# Patient Record
Sex: Female | Born: 1975 | Race: White | Hispanic: No | Marital: Single | Smoking: Current every day smoker
Health system: Southern US, Community
[De-identification: ages and names within clinical notes are randomized; demographics above are authoritative.]

## PROBLEM LIST (undated history)

## (undated) DIAGNOSIS — O99345 Other mental disorders complicating the puerperium: Secondary | ICD-10-CM

## (undated) DIAGNOSIS — J45909 Unspecified asthma, uncomplicated: Secondary | ICD-10-CM

## (undated) DIAGNOSIS — B029 Zoster without complications: Secondary | ICD-10-CM

## (undated) DIAGNOSIS — R55 Syncope and collapse: Secondary | ICD-10-CM

## (undated) DIAGNOSIS — F329 Major depressive disorder, single episode, unspecified: Secondary | ICD-10-CM

## (undated) DIAGNOSIS — R011 Cardiac murmur, unspecified: Secondary | ICD-10-CM

## (undated) DIAGNOSIS — R51 Headache: Secondary | ICD-10-CM

## (undated) DIAGNOSIS — F32A Depression, unspecified: Secondary | ICD-10-CM

## (undated) DIAGNOSIS — G459 Transient cerebral ischemic attack, unspecified: Secondary | ICD-10-CM

## (undated) DIAGNOSIS — F53 Postpartum depression: Secondary | ICD-10-CM

## (undated) DIAGNOSIS — Z8619 Personal history of other infectious and parasitic diseases: Secondary | ICD-10-CM

## (undated) DIAGNOSIS — R519 Headache, unspecified: Secondary | ICD-10-CM

## (undated) HISTORY — DX: Transient cerebral ischemic attack, unspecified: G45.9

## (undated) HISTORY — DX: Personal history of other infectious and parasitic diseases: Z86.19

## (undated) HISTORY — DX: Cardiac murmur, unspecified: R01.1

## (undated) HISTORY — DX: Depression, unspecified: F32.A

## (undated) HISTORY — PX: WISDOM TOOTH EXTRACTION: SHX21

## (undated) HISTORY — DX: Syncope and collapse: R55

## (undated) HISTORY — DX: Unspecified asthma, uncomplicated: J45.909

## (undated) HISTORY — DX: Headache: R51

## (undated) HISTORY — DX: Headache, unspecified: R51.9

## (undated) HISTORY — PX: TUBAL LIGATION: SHX77

## (undated) HISTORY — DX: Major depressive disorder, single episode, unspecified: F32.9

---
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
## 2008-12-22 ENCOUNTER — Emergency Department (HOSPITAL_BASED_OUTPATIENT_CLINIC_OR_DEPARTMENT_OTHER): Admission: EM | Admit: 2008-12-22 | Discharge: 2008-12-22 | Payer: Self-pay | Admitting: Emergency Medicine

## 2010-04-23 HISTORY — PX: OTHER SURGICAL HISTORY: SHX169

## 2011-01-05 ENCOUNTER — Emergency Department (HOSPITAL_BASED_OUTPATIENT_CLINIC_OR_DEPARTMENT_OTHER)
Admission: EM | Admit: 2011-01-05 | Discharge: 2011-01-05 | Discharge status: Against Medical Advice | Payer: Medicaid Other | Attending: Emergency Medicine | Admitting: Emergency Medicine

## 2011-01-05 ENCOUNTER — Encounter: Payer: Self-pay | Admitting: *Deleted

## 2011-01-05 DIAGNOSIS — M79609 Pain in unspecified limb: Secondary | ICD-10-CM | POA: Insufficient documentation

## 2011-01-05 NOTE — ED Notes (Signed)
Pain in both of her hands for months. Had twins 5 weeks ago. Her MD told her she has carpel tunnel and the symptoms would go away after delivery. Pain is no better.

## 2011-01-05 NOTE — ED Notes (Signed)
Pt stopped at registration desk states " I dont have time to wait for this I know I have carpal tunnell I just need something for the pain and see if I need to be referred." States to registration that she will follow up with her doctor next week

## 2011-04-24 DIAGNOSIS — G459 Transient cerebral ischemic attack, unspecified: Secondary | ICD-10-CM

## 2011-04-24 HISTORY — DX: Transient cerebral ischemic attack, unspecified: G45.9

## 2011-10-06 ENCOUNTER — Encounter (HOSPITAL_BASED_OUTPATIENT_CLINIC_OR_DEPARTMENT_OTHER): Payer: Self-pay | Admitting: *Deleted

## 2011-10-06 ENCOUNTER — Emergency Department (HOSPITAL_BASED_OUTPATIENT_CLINIC_OR_DEPARTMENT_OTHER)
Admission: EM | Admit: 2011-10-06 | Discharge: 2011-10-06 | Disposition: A | Discharge status: Home / Self Care | Payer: BC Managed Care – PPO | Attending: Emergency Medicine | Admitting: Emergency Medicine

## 2011-10-06 DIAGNOSIS — B029 Zoster without complications: Secondary | ICD-10-CM | POA: Insufficient documentation

## 2011-10-06 DIAGNOSIS — F17200 Nicotine dependence, unspecified, uncomplicated: Secondary | ICD-10-CM | POA: Insufficient documentation

## 2011-10-06 HISTORY — DX: Zoster without complications: B02.9

## 2011-10-06 MED ORDER — VALACYCLOVIR HCL 1 G PO TABS: 1000.0000 mg | ORAL_TABLET | Freq: Three times a day (TID) | ORAL | Status: AC

## 2011-10-06 NOTE — Discharge Instructions (Signed)
Shingles Shingles is caused by the same virus that causes chickenpox (varicella zoster virus or VZV). Shingles often occurs many years or decades after having chickenpox. That is why it is more common in adults older than 50 years. The virus reactivates and breaks out as an infection in a nerve root. SYMPTOMS   The initial feeling (sensations) may be pain. This pain is usually described as:   Burning.   Stabbing.   Throbbing.   Tingling in the nerve root.   A red rash will follow in a couple days. The rash may occur in any area of the body and is usually on one side (unilateral) of the body in a band or belt-like pattern. The rash usually starts out as very small blisters (vesicles). They will dry up after 7 to 10 days. This is not usually a significant problem except for the pain it causes.   Long-lasting (chronic) pain is more likely in an elderly person. It can last months to years. This condition is called postherpetic neuralgia.  Shingles can be an extremely severe infection in someone with AIDS, a weakened immune system, or with forms of leukemia. It can also be severe if you are taking transplant medicines or other medicines that weaken the immune system. TREATMENT  Your caregiver will often treat you with:  Antiviral drugs.   Anti-inflammatory drugs.   Pain medicines.  Bed rest is very important in preventing the pain associated with herpes zoster (postherpetic neuralgia). Application of heat in the form of a hot water bottle or electric heating pad or gentle pressure with the hand is recommended to help with the pain or discomfort. PREVENTION  A varicella zoster vaccine is available to help protect against the virus. The Food and Drug Administration approved the varicella zoster vaccine for individuals 82 years of age and older. HOME CARE INSTRUCTIONS   Cool compresses to the area of rash may be helpful.   Only take over-the-counter or prescription medicines for pain,  discomfort, or fever as directed by your caregiver.   Avoid contact with:   Babies.   Pregnant women.   Children with eczema.   Elderly people with transplants.   People with chronic illnesses, such as leukemia and AIDS.   If the area involved is on your face, you may receive a referral for follow-up to a specialist. It is very important to keep all follow-up appointments. This will help avoid eye complications, chronic pain, or disability.  SEEK IMMEDIATE MEDICAL CARE IF:   You develop any pain (headache) in the area of the face or eye. This must be followed carefully by your caregiver or ophthalmologist. An infection in part of your eye (cornea) can be very serious. It could lead to blindness.   You do not have pain relief from prescribed medicines.   Your redness or swelling spreads.   The area involved becomes very swollen and painful.   You have a fever.   You notice any red or painful lines extending away from the affected area toward your heart (lymphangitis).   Your condition is worsening or has changed.  Document Released: 04/09/2005 Document Revised: 03/29/2011 Document Reviewed: 03/14/2009 [facility] Patient Information 2012 [place], [place].  Valacyclovir caplets What is this medicine? VALACYCLOVIR (val ay SYE kloe veer) is an antiviral medicine. It is used to treat or prevent infections caused by certain kinds of viruses. Examples of these infections include herpes and shingles. This medicine will not cure herpes. This medicine may be used for other  purposes; ask your health care provider or pharmacist if you have questions. What should I tell my health care provider before I take this medicine? They need to know if you have any of these conditions: -acquired immunodeficiency syndrome (AIDS) -any other condition that may weaken the immune system -bone marrow or kidney transplant -kidney disease -an unusual or allergic reaction to valacyclovir, acyclovir,  ganciclovir, valganciclovir, other medicines, foods, dyes, or preservatives -pregnant or trying to get pregnant -breast-feeding How should I use this medicine? Take this medicine by mouth with a glass of water. Follow the directions on the prescription label. You can take this medicine with or without food. Take your doses at regular intervals. Do not take your medicine more often than directed. Finish the full course prescribed by your doctor or health care professional even if you think your condition is better. Do not stop taking except on the advice of your doctor or health care professional. Talk to your pediatrician regarding the use of this medicine in children. While this drug may be prescribed for children as young as 2 years for selected conditions, precautions do apply. Overdosage: If you think you have taken too much of this medicine contact a poison control center or emergency room at once. NOTE: This medicine is only for you. Do not share this medicine with others. What if I miss a dose? If you miss a dose, take it as soon as you can. If it is almost time for your next dose, take only that dose. Do not take double or extra doses. What may interact with this medicine? -cimetidine -probenecid This list may not describe all possible interactions. Give your health care provider a list of all the medicines, herbs, non-prescription drugs, or dietary supplements you use. Also tell them if you smoke, drink alcohol, or use illegal drugs. Some items may interact with your medicine. What should I watch for while using this medicine? Tell your doctor or health care professional if your symptoms do not start to get better after 1 week. This medicine works best when taken early in the course of an infection, within the first 72 hours. Begin treatment as soon as possible after the first signs of infection like tingling, itching, or pain in the affected area. It is possible that genital herpes may  still be spread even when you are not having symptoms. Always use safer sex practices like condoms made of latex or polyurethane whenever you have sexual contact. You should stay well hydrated while taking this medicine. Drink plenty of fluids. What side effects may I notice from receiving this medicine? Side effects that you should report to your doctor or health care professional as soon as possible: -allergic reactions like skin rash, itching or hives, swelling of the face, lips, or tongue -aggressive behavior -confusion -hallucinations -problems with balance, talking, walking -stomach pain -tremor -trouble passing urine or change in the amount of urine Side effects that usually do not require medical attention (report to your doctor or health care professional if they continue or are bothersome): -dizziness -headache -nausea, vomiting This list may not describe all possible side effects. Call your doctor for medical advice about side effects. You may report side effects to FDA at 1-800-FDA-1088. Where should I keep my medicine? Keep out of the reach of children. Store at room temperature between 15 and 25 degrees C (59 and 77 degrees F). Keep container tightly closed. Throw away any unused medicine after the expiration date. NOTE: This sheet is a  summary. It may not cover all possible information. If you have questions about this medicine, talk to your doctor, pharmacist, or health care provider.  2012, Elsevier/Gold Standard. (06/16/2007 6:10:08 PM)

## 2011-10-06 NOTE — ED Provider Notes (Signed)
History     CSN: [id number]  Arrival date & time 10/06/11  1139   First MD Initiated Contact with Patient 10/06/11 1202      Chief Complaint  Patient presents with  . Rash    (Consider location/radiation/quality/duration/timing/severity/associated sxs/prior treatment) Patient is a 36 y.o. female presenting with rash. The history is provided by the patient.  Rash   She had onset 3 days ago of a rash in the left lateral abdomen with associated burning pain typical of outbreaks of shingles. 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 She came to the emergency department today because her place of employment didn't want to allow her to return unless there was a doctor's note stating it was safe. Pain is mild to moderate with current pain rated 3/10. She's been taking over-the-counter analgesics for pain with reasonable relief. She denies fever or chills.   Past Medical History  Diagnosis Date  . Shingles     Past Surgical History  Procedure Date  . Cesarean section   . Appendectomy   . Tonsillectomy     No family history on file.  History  Substance Use Topics  . Smoking status: Current Everyday Smoker -- 0.5 packs/day  . Smokeless tobacco: Never Used  . Alcohol Use: No    OB History    Grav Para Term Preterm Abortions TAB SAB Ect Mult Living                  Review of Systems  Skin: Positive for rash.  All other systems reviewed and are negative.    Allergies  Betadine  Home Medications   Current Outpatient Rx  Name Route Sig Dispense Refill  . SERTRALINE HCL 100 MG PO TABS Oral Take 100 mg by mouth daily.    . IBUPROFEN PO Oral Take by mouth.      Marland Kitchen PRENATAL PO Oral Take by mouth.      Marland Kitchen VALACYCLOVIR HCL 1 G PO TABS Oral Take 1 tablet (1,000 mg total) by mouth 3 (three) times daily. 21 tablet 0    BP 122/69  Pulse 70  Temp 98.5 F (36.9 C) (Oral)  Resp 18  Ht 5\' 5"  (1.651 m)  Wt 155 lb (70.308 kg)  BMI 25.79 kg/m2   SpO2 100%  LMP 09/22/2011  Physical Exam  Nursing note and vitals reviewed.  36 year old female is resting comfortably in no acute distress. Vital signs are normal. Head is normal cephalic and atraumatic. PERRLA, EOMI. Neck is nontender and supple. Back is nontender. Lungs are clear without rales, wheezes, rhonchi. Heart has regular rate rhythm without murmur. Abdomen is soft, flat, nontender without masses or hepatosplenomegaly. Jimmy's have no cyanosis or edema, full range of motion is present. Skin is warm and dry. Rashes present in the left lateral abdomen and back. The rash and has breast vesicles on erythematous base consistent with herpes zoster. Neurologic: Mental status is normal, cranial nerves are intact, there are no motor or sensory deficits.  ED Course  Procedures (including critical care time)  Labs Reviewed - No data to display No results found.   1. Herpes zoster       MDM  Varicella-zoster in what appears to be at T11 or T12 dermatome on the left. She will be sent home with prescription for Valtrex. Note is given allowing her to work so long as the lesions are covered.        Dione Booze,  MD 10/06/11 1226

## 2011-10-06 NOTE — ED Notes (Signed)
Red burning rash to left flank- hx of shingles

## 2012-02-12 ENCOUNTER — Observation Stay (HOSPITAL_BASED_OUTPATIENT_CLINIC_OR_DEPARTMENT_OTHER)
Admission: EM | Admit: 2012-02-12 | Discharge: 2012-02-13 | Disposition: A | Discharge status: Home / Self Care | Payer: BC Managed Care – PPO | Attending: Emergency Medicine | Admitting: Emergency Medicine

## 2012-02-12 ENCOUNTER — Emergency Department (HOSPITAL_BASED_OUTPATIENT_CLINIC_OR_DEPARTMENT_OTHER): Payer: BC Managed Care – PPO

## 2012-02-12 ENCOUNTER — Encounter (HOSPITAL_BASED_OUTPATIENT_CLINIC_OR_DEPARTMENT_OTHER): Payer: Self-pay

## 2012-02-12 DIAGNOSIS — R209 Unspecified disturbances of skin sensation: Principal | ICD-10-CM | POA: Insufficient documentation

## 2012-02-12 DIAGNOSIS — R531 Weakness: Secondary | ICD-10-CM

## 2012-02-12 DIAGNOSIS — R4781 Slurred speech: Secondary | ICD-10-CM

## 2012-02-12 DIAGNOSIS — H539 Unspecified visual disturbance: Secondary | ICD-10-CM | POA: Insufficient documentation

## 2012-02-12 DIAGNOSIS — N390 Urinary tract infection, site not specified: Secondary | ICD-10-CM

## 2012-02-12 DIAGNOSIS — R42 Dizziness and giddiness: Secondary | ICD-10-CM | POA: Insufficient documentation

## 2012-02-12 DIAGNOSIS — R4701 Aphasia: Secondary | ICD-10-CM | POA: Insufficient documentation

## 2012-02-12 HISTORY — DX: Postpartum depression: F53.0

## 2012-02-12 HISTORY — DX: Other mental disorders complicating the puerperium: O99.345

## 2012-02-12 LAB — URINALYSIS, ROUTINE W REFLEX MICROSCOPIC
Bilirubin Urine: NEGATIVE
Glucose, UA: NEGATIVE mg/dL
Hgb urine dipstick: NEGATIVE
Specific Gravity, Urine: 1.012 (ref 1.005–1.030)
Urobilinogen, UA: 0.2 mg/dL (ref 0.0–1.0)

## 2012-02-12 LAB — RAPID URINE DRUG SCREEN, HOSP PERFORMED
Amphetamines: NOT DETECTED
Benzodiazepines: NOT DETECTED
Cocaine: NOT DETECTED
Opiates: NOT DETECTED

## 2012-02-12 LAB — COMPREHENSIVE METABOLIC PANEL
BUN: 17 mg/dL (ref 6–23)
Calcium: 9.7 mg/dL (ref 8.4–10.5)
Creatinine, Ser: 0.7 mg/dL (ref 0.50–1.10)
GFR calc Af Amer: >90 mL/min (ref >90)
GFR calc non Af Amer: >90 mL/min (ref >90)
Glucose, Bld: 92 mg/dL (ref 70–99)
Sodium: 140 mEq/L (ref 135–145)
Total Protein: 7.4 g/dL (ref 6.0–8.3)

## 2012-02-12 LAB — DIFFERENTIAL
Eosinophils Absolute: 0.3 10*3/uL (ref 0.0–0.7)
Eosinophils Relative: 4 % (ref 0–5)
Lymphs Abs: 2.7 10*3/uL (ref 0.7–4.0)
Monocytes Absolute: 1.1 10*3/uL — ABNORMAL HIGH (ref 0.1–1.0)
Monocytes Relative: 13 % — ABNORMAL HIGH (ref 3–12)

## 2012-02-12 LAB — CBC
HCT: 37.6 % (ref 36.0–46.0)
MCH: 28.5 pg (ref 26.0–34.0)
MCV: 85.1 fL (ref 78.0–100.0)
Platelets: 210 10*3/uL (ref 150–400)
RBC: 4.42 MIL/uL (ref 3.87–5.11)

## 2012-02-12 LAB — URINE MICROSCOPIC-ADD ON

## 2012-02-12 LAB — GLUCOSE, CAPILLARY: Glucose-Capillary: 85 mg/dL (ref 70–99)

## 2012-02-12 LAB — PROTIME-INR: Prothrombin Time: 13 seconds (ref 11.6–15.2)

## 2012-02-12 MED ORDER — ACETAMINOPHEN 325 MG PO TABS
ORAL_TABLET | ORAL | Status: AC
  Filled 2012-02-12: qty 2

## 2012-02-12 MED ORDER — TETRACAINE HCL 0.5 % OP SOLN
2.0000 [drp] | Freq: Once | OPHTHALMIC | Status: AC
  Administered 2012-02-13: 2 [drp] via OPHTHALMIC
  Filled 2012-02-12: qty 2

## 2012-02-12 MED ORDER — ACETAMINOPHEN 325 MG PO TABS
650.0000 mg | ORAL_TABLET | Freq: Once | ORAL | Status: AC
  Administered 2012-02-12: 650 mg via ORAL

## 2012-02-12 MED ORDER — FLUORESCEIN SODIUM 1 MG OP STRP
1.0000 | ORAL_STRIP | Freq: Once | OPHTHALMIC | Status: AC
  Administered 2012-02-13: 1 via OPHTHALMIC
  Filled 2012-02-12 (×2): qty 1

## 2012-02-12 NOTE — ED Notes (Signed)
Pt c/o neck pain, jaw pain x2 weeks.

## 2012-02-12 NOTE — ED Notes (Signed)
Pt speaking with clear sentences, denies numbness. Right eye conjunctiva is mildly red.

## 2012-02-12 NOTE — ED Notes (Signed)
[facility] for transport to [facility] , no truck available from [facility]   For " awhile"

## 2012-02-12 NOTE — ED Notes (Signed)
Pt reports intermittent facial numbness/tingling, difficulty speaking and dizziness since Thursday.

## 2012-02-12 NOTE — ED Notes (Signed)
Report called to [facility] RN CDU

## 2012-02-12 NOTE — ED Notes (Signed)
Patient transported to CT 

## 2012-02-12 NOTE — ED Notes (Signed)
Pt settled in room, NAD noted. Neuro intact.

## 2012-02-12 NOTE — ED Provider Notes (Signed)
History     CSN: [id number] Arrival date & time 02/12/12  1819 MD Initiated Contact with Patient 02/12/12 1856      Chief Complaint  Patient presents with  . Numbness  . Aphasia  . Dizziness    HPI She has been having trouble with numbness and dizziness over the past week since Thursday.  She has also been noticing trouble with her speech being slurred.  She feels as if she is almost intoxicated.  No previous symptoms like this.  No focal weakness in her arms or legs.  No prior history of this trouble. She has been having a lot of tension and stress recently.  She is not sure if that is related. She does feel like her balance is off.  Past Medical History  Diagnosis Date  . Shingles   . Post partum depression     Past Surgical History  Procedure Date  . Cesarean section   . Appendectomy   . Tonsillectomy     No family history on file., father had CHF and is deceased, no history of stroke  History  Substance Use Topics  . Smoking status: Current Every Day Smoker -- 0.5 packs/day    Types: Cigarettes  . Smokeless tobacco: Never Used  . Alcohol Use: No    OB History    Grav Para Term Preterm Abortions TAB SAB Ect Mult Living                  Review of Systems  All other systems reviewed and are negative.    Allergies  Betadine  Home Medications   Current Outpatient Rx  Name Route Sig Dispense Refill  . IBUPROFEN PO Oral Take by mouth.      Marland Kitchen PRENATAL PO Oral Take by mouth.      . SERTRALINE HCL 100 MG PO TABS Oral Take 100 mg by mouth daily.      BP 113/75  Pulse 74  Temp 97.8 F (36.6 C) (Oral)  Resp 16  Ht 5\' 5"  (1.651 m)  Wt 155 lb (70.308 kg)  BMI 25.79 kg/m2  SpO2 99%  LMP 01/30/2012  Physical Exam  Nursing note and vitals reviewed. Constitutional: She is oriented to person, place, and time. She appears well-developed and well-nourished. No distress.  HENT:  Head: Normocephalic and atraumatic.  Right Ear: External ear normal.  Left  Ear: External ear normal.  Mouth/Throat: Oropharynx is clear and moist.  Eyes: Conjunctivae normal are normal. Right eye exhibits no discharge. Left eye exhibits no discharge. No scleral icterus.       Mild injection right eye  Neck: Neck supple. No tracheal deviation present.  Cardiovascular: Normal rate, regular rhythm and intact distal pulses.   Pulmonary/Chest: Effort normal and breath sounds normal. No stridor. No respiratory distress. She has no wheezes. She has no rales.  Abdominal: Soft. Bowel sounds are normal. She exhibits no distension. There is no tenderness. There is no rebound and no guarding.  Musculoskeletal: She exhibits no edema and no tenderness.  Neurological: She is alert and oriented to person, place, and time. She has normal strength. She displays normal reflexes. No cranial nerve deficit (occsnl stutter but no aphasia) or sensory deficit. She exhibits normal muscle tone. She displays no seizure activity. Coordination (unsteadines with Rhomberg) abnormal.       No pronator drift bilateral upper extrem, able to hold both legs off bed for 5 seconds although difficult with lifting right leg compared to left, sensation intact  in all extremities except right leg feels "heavy", no visual field cuts, no left or right sided neglect  Skin: Skin is warm and dry. No rash noted.  Psychiatric: She has a normal mood and affect.    ED Course  Procedures (including critical care time)  Rate: 67  Rhythm: normal sinus rhythm  QRS Axis: normal  Intervals: normal  ST/T Wave abnormalities: normal  Conduction Disutrbances:none  Narrative Interpretation: Normal  Old EKG Reviewed: none available  Labs Reviewed  DIFFERENTIAL - Abnormal; Notable for the following:    Monocytes Relative 13 (*)     Monocytes Absolute 1.1 (*)     All other components within normal limits  COMPREHENSIVE METABOLIC PANEL - Abnormal; Notable for the following:    Total Bilirubin 0.1 (*)     All other  components within normal limits  GLUCOSE, CAPILLARY  PROTIME-INR  APTT  CBC  URINE RAPID DRUG SCREEN (HOSP PERFORMED)  TROPONIN I  PREGNANCY, URINE   Ct Head Wo Contrast  02/12/2012  *RADIOLOGY REPORT*  Clinical Data: Numbness and aphasia.  CT HEAD WITHOUT CONTRAST  Technique:  Contiguous axial images were obtained from the base of the skull through the vertex without contrast.  Comparison: None.  Findings: Normal appearance of the intracranial structures.  No evidence for acute hemorrhage, mass lesion, midline shift, hydrocephalus or large infarct.  Normal appearance of ventricles and basal cisterns.  The visualized sinuses are clear.  No acute bone abnormality.  IMPRESSION: Normal head CT.   Original Report Authenticated By: Richarda Overlie, M.D.      MDM  The patient's symptoms are concerning for the possibility of a TIA or occult stroke.  It certainly is possible that the symptoms could be related to stress, anxiety or any typical migraine. I do feel the patient should have an MRI to exclude stroke. I am unable to get that accomplished here.  Patient be transferred to [facility] CDU to finish her workup. Patient agrees with this plan. I have discussed the case with Dr. Jeraldine Loots and NP Derald Macleod, MD 02/12/12 2101

## 2012-02-12 NOTE — ED Notes (Signed)
MD at bedside. 

## 2012-02-13 ENCOUNTER — Observation Stay (HOSPITAL_COMMUNITY): Payer: BC Managed Care – PPO

## 2012-02-13 DIAGNOSIS — R5381 Other malaise: Secondary | ICD-10-CM

## 2012-02-13 DIAGNOSIS — R4789 Other speech disturbances: Secondary | ICD-10-CM

## 2012-02-13 DIAGNOSIS — G459 Transient cerebral ischemic attack, unspecified: Secondary | ICD-10-CM

## 2012-02-13 LAB — LIPID PANEL
LDL Cholesterol: 68 mg/dL (ref 0–99)
Total CHOL/HDL Ratio: 2.3 RATIO
Triglycerides: 69 mg/dL (ref <150)
VLDL: 14 mg/dL (ref 0–40)

## 2012-02-13 MED ORDER — METRONIDAZOLE 500 MG PO TABS: 500.0000 mg | ORAL_TABLET | Freq: Two times a day (BID) | ORAL | Status: DC

## 2012-02-13 MED ORDER — LORAZEPAM 2 MG/ML IJ SOLN
1.0000 mg | Freq: Once | INTRAMUSCULAR | Status: AC
  Administered 2012-02-13: 1 mg via INTRAVENOUS
  Filled 2012-02-13: qty 1

## 2012-02-13 NOTE — ED Notes (Signed)
PT WAITING ON PA TO WRITE DISCHARGE PAPERS AND PT IS CALLING FOR A RIDE HOME.

## 2012-02-13 NOTE — Progress Notes (Signed)
VASCULAR LAB PRELIMINARY  PRELIMINARY  PRELIMINARY  PRELIMINARY  Carotid duplex completed.    Preliminary report:  Bilateral:  No evidence of hemodynamically significant internal carotid artery stenosis.   Vertebral artery flow is antegrade.     Adair Lauderback, RVS 02/13/2012, 9:04 AM

## 2012-02-13 NOTE — Progress Notes (Signed)
  Echocardiogram 2D Echocardiogram has been performed.  Judy Walters 02/13/2012, 9:08 AM

## 2012-02-13 NOTE — ED Notes (Signed)
Report given to greg

## 2012-02-13 NOTE — ED Provider Notes (Signed)
Medical screening examination/treatment/procedure(s) were performed by non-physician practitioner and as supervising physician I was immediately available for consultation/collaboration.  Doug Sou, MD 02/13/12 1712

## 2012-02-13 NOTE — ED Notes (Signed)
MRI called and states patient does not want to have the MRI due to her being claustrophobic.  MD notified and Med ordered for patient to complete MRI.

## 2012-02-13 NOTE — ED Notes (Signed)
Pt states onset symptoms Thursday. States initally she felt dizzy "the feeling you get when you are drunk" . States she felt "stumbly" when she would walk due to the dizziness. States she did have nausea with the dizziness. States she also noticed a headache then. The headache was frontal behind her right eye. States "my eye felt irritated like there was something in it when my head hurt and it would be blurry". Denies vision change this morning. Denies feeling weak or dizzy. Speech is clear and articulate. Pt aware of plan of care and agreeable at this time

## 2012-02-13 NOTE — Progress Notes (Signed)
Utilization review completed.  

## 2012-02-13 NOTE — ED Provider Notes (Signed)
7:38 AM BP 110/75  Pulse 56  Temp 97.8 F (36.6 C) (Oral)  Resp 11  Ht 5\' 5"  (1.651 m)  Wt 155 lb (70.308 kg)  BMI 25.79 kg/m2  SpO2 98%  LMP 01/30/2012 Assumed care of the patient in CDU.  Patient is transferred [facility].  She's here on TIA protocol for complaints of dizziness and visual disturbance since last Thursday. CV: RRR, No M/R/G, Peripheral pulses intact. No peripheral edema. Lungs: CTAB Abd: Soft, Non tender, non distended   9:38 AM Negative Head CT. MRI/MRA. Ultrasound: Carotid duplex completed.  Preliminary report: Bilateral: No evidence of hemodynamically significant internal carotid artery stenosis. Vertebral artery flow is antegrade.  Awaiting 2d Echo.  10:31 AM Trivial mitral regurge. Otherwise normal echo. No cardiology or neuro follow up.  Will discharge with PCP follow up. UTI with Trich.  Patient advised. [facility] on discharge.Discussed reasons to seek immediate care. Patient expresses understanding and agrees with plan.   Arthor Captain, PA-C 02/13/12 1033  Arthor Captain, PA-C 02/13/12 1042

## 2012-02-13 NOTE — ED Provider Notes (Signed)
Patient transferred from [facility] for observation TIA protocol.  Patient resting comfortably at present except for complaint of right eye pain that has been ongoing for several days.  Patient wears contacts regularly.  No visual disturbance.  Patient awake, alert.  Lungs CTA bilaterally.  S1/S2, RRR.  Abdomen soft, bowel sounds present.  MOE X 4, distal pulses and sensation intact.  Symmetric smile.  No arm drift.   Right eye examined for foreign bodies and corneal abrasion after application of tetracaine, fluoroscein.  No foreign body or corneal abrasion identified.  TIA protocol discussed with patient.    Jimmye Norman, NP 02/13/12 [phone]

## 2012-02-13 NOTE — ED Provider Notes (Signed)
I accepted this patient in transfer for further E/M of her complaints.  She was placed on the CDU TIA protocol.    Gerhard Munch, MD 02/13/12 [phone]

## 2012-02-13 NOTE — ED Notes (Signed)
Breakfast ordered 

## 2012-02-13 NOTE — ED Notes (Signed)
Patient transported to MRI 

## 2012-11-04 ENCOUNTER — Emergency Department (INDEPENDENT_AMBULATORY_CARE_PROVIDER_SITE_OTHER): Payer: BC Managed Care – PPO

## 2012-11-04 ENCOUNTER — Encounter: Payer: Self-pay | Admitting: *Deleted

## 2012-11-04 ENCOUNTER — Emergency Department (INDEPENDENT_AMBULATORY_CARE_PROVIDER_SITE_OTHER)
Admission: EM | Admit: 2012-11-04 | Discharge: 2012-11-04 | Disposition: A | Discharge status: Home / Self Care | Payer: BC Managed Care – PPO | Source: Home / Self Care | Attending: Family Medicine | Admitting: Family Medicine

## 2012-11-04 DIAGNOSIS — Z716 Tobacco abuse counseling: Secondary | ICD-10-CM

## 2012-11-04 DIAGNOSIS — F17200 Nicotine dependence, unspecified, uncomplicated: Secondary | ICD-10-CM

## 2012-11-04 DIAGNOSIS — R05 Cough: Secondary | ICD-10-CM

## 2012-11-04 DIAGNOSIS — R062 Wheezing: Secondary | ICD-10-CM

## 2012-11-04 DIAGNOSIS — J309 Allergic rhinitis, unspecified: Secondary | ICD-10-CM

## 2012-11-04 DIAGNOSIS — J069 Acute upper respiratory infection, unspecified: Secondary | ICD-10-CM

## 2012-11-04 DIAGNOSIS — R0602 Shortness of breath: Secondary | ICD-10-CM

## 2012-11-04 MED ORDER — IPRATROPIUM-ALBUTEROL 0.5-2.5 (3) MG/3ML IN SOLN
3.0000 mL | Freq: Once | RESPIRATORY_TRACT | Status: AC
  Administered 2012-11-04: 3 mL via RESPIRATORY_TRACT

## 2012-11-04 MED ORDER — ALBUTEROL SULFATE HFA 108 (90 BASE) MCG/ACT IN AERS: 2.0000 | INHALATION_SPRAY | RESPIRATORY_TRACT | Status: DC | PRN

## 2012-11-04 MED ORDER — METHYLPREDNISOLONE SODIUM SUCC 125 MG IJ SOLR
125.0000 mg | Freq: Once | INTRAMUSCULAR | Status: AC
  Administered 2012-11-04: 125 mg via INTRAMUSCULAR

## 2012-11-04 MED ORDER — AZITHROMYCIN 250 MG PO TABS: ORAL_TABLET | ORAL | Status: DC

## 2012-11-04 MED ORDER — PREDNISONE 50 MG PO TABS: ORAL_TABLET | ORAL | Status: DC

## 2012-11-04 NOTE — ED Provider Notes (Signed)
History    CSN: [id number] Arrival date & time 11/04/12  1334  First MD Initiated Contact with Patient 11/04/12 1406     Chief Complaint  Patient presents with  . Cough  . Eye Drainage    HPI  URI Symptoms Onset: 2 weeks  Description: cough, chest tightness, rhinorrhea, mild nasal congestion, eye itching  Modifying factors:  1 PPD smoker   Symptoms Nasal discharge: yes Fever: no Sore throat: mild Cough: yes Wheezing: mild; chest tightness Ear pain: no GI symptoms: no Sick contacts: no  Red Flags  Stiff neck: no Dyspnea: chest tightness Rash: no Swallowing difficulty: no  Sinusitis Risk Factors Headache/face pain: no Double sickening: no tooth pain: no  Allergy Risk Factors Sneezing: yes Itchy scratchy throat: yes Seasonal symptoms: unsure  Flu Risk Factors Headache: no muscle aches: no severe fatigue: no    Past Medical History  Diagnosis Date  . Shingles   . Post partum depression    Past Surgical History  Procedure Laterality Date  . Cesarean section    . Appendectomy    . Tonsillectomy    . Tubal ligation     Family History  Problem Relation Age of Onset  . Hypertension Mother   . Cancer Mother     breast CA  . COPD Father   . Diabetes Father   . Diabetes Sister    History  Substance Use Topics  . Smoking status: Current Every Day Smoker -- 1.00 packs/day for 15 years    Types: Cigarettes  . Smokeless tobacco: Never Used  . Alcohol Use: No   OB History   Grav Para Term Preterm Abortions TAB SAB Ect Mult Living                 Review of Systems  All other systems reviewed and are negative.    Allergies  Betadine  Home Medications   Current Outpatient Rx  Name  Route  Sig  Dispense  Refill  . azithromycin (ZITHROMAX) 250 MG tablet      Take 2 tabs PO x 1 dose, then 1 tab PO QD x 4 days   6 tablet   0   . predniSONE (DELTASONE) 50 MG tablet      1 tab daily x 5 days   5 tablet   0    BP 103/71  Pulse 72   Temp(Src) 97.9 F (36.6 C) (Oral)  Resp 14  Ht 5\' 5"  (1.651 m)  Wt 163 lb (73.936 kg)  BMI 27.12 kg/m2  SpO2 98%  LMP 10/31/2012 Physical Exam  Constitutional: She appears well-developed and well-nourished.  HENT:  Head: Normocephalic and atraumatic.  Right Ear: External ear normal.  Left Ear: External ear normal.  +nasal erythema, rhinorrhea bilaterally, + post oropharyngeal erythema    Eyes: Conjunctivae are normal. Pupils are equal, round, and reactive to light.  Neck: Normal range of motion. Neck supple.  Cardiovascular: Normal rate and regular rhythm.   Pulmonary/Chest: Effort normal.  Faint wheezes in apices    Abdominal: Soft.  Musculoskeletal: Normal range of motion.  Lymphadenopathy:    She has no cervical adenopathy.  Neurological: She is alert.  Skin: Skin is warm.    ED Course  Procedures (including critical care time) Labs Reviewed - No data to display Dg Chest 2 View  11/04/2012   *RADIOLOGY REPORT*  Clinical Data: Cough and congestion.  Shortness of breath.  CHEST - 2 VIEW  Comparison: No priors.  Findings: Lung  volumes are normal.  No consolidative airspace disease.  No pleural effusions.  No pneumothorax.  No pulmonary nodule or mass noted.  Pulmonary vasculature and the cardiomediastinal silhouette are within normal limits.  IMPRESSION: 1. No radiographic evidence of acute cardiopulmonary disease.   Original Report Authenticated By: Trudie Reed, M.D.   1. URI (upper respiratory infection)   2. Allergic rhinitis   3. Wheezing     MDM  Suspect overlapping viral and allergic processes with secondary obstructive lung disease exacerbation.  Solumedrol 125 mg IM x1 Duoneb x 1. Noted improvement in aeration s/p treatment.  Will place on course of prednisone and zpak (for atypical coverage) CXR WNL.  Discussed smoking cessation at length.  Discussed infectious and resp red flags including worsening wheezing/chest tightness that would warrant reevalution.   Follow up as needed.     The patient and/or caregiver has been counseled thoroughly with regard to treatment plan and/or medications prescribed including dosage, schedule, interactions, rationale for use, and possible side effects and they verbalize understanding. Diagnoses and expected course of recovery discussed and will return if not improved as expected or if the condition worsens. Patient and/or caregiver verbalized understanding.        Doree Albee, MD 11/04/12 [phone]

## 2012-11-04 NOTE — ED Notes (Signed)
Judy Walters c/o eye drainage and pressure, productive cough and chills x 2 weeks. Taken sudafed, Day/Nyquil otc.

## 2013-11-04 ENCOUNTER — Emergency Department (HOSPITAL_BASED_OUTPATIENT_CLINIC_OR_DEPARTMENT_OTHER)
Admission: EM | Admit: 2013-11-04 | Discharge: 2013-11-04 | Disposition: A | Discharge status: Home / Self Care | Payer: Worker's Compensation | Attending: Emergency Medicine | Admitting: Emergency Medicine

## 2013-11-04 ENCOUNTER — Encounter (HOSPITAL_BASED_OUTPATIENT_CLINIC_OR_DEPARTMENT_OTHER): Payer: Self-pay | Admitting: Emergency Medicine

## 2013-11-04 DIAGNOSIS — S0990XA Unspecified injury of head, initial encounter: Secondary | ICD-10-CM | POA: Diagnosis present

## 2013-11-04 DIAGNOSIS — IMO0002 Reserved for concepts with insufficient information to code with codable children: Secondary | ICD-10-CM | POA: Insufficient documentation

## 2013-11-04 DIAGNOSIS — Y9389 Activity, other specified: Secondary | ICD-10-CM | POA: Insufficient documentation

## 2013-11-04 DIAGNOSIS — Z792 Long term (current) use of antibiotics: Secondary | ICD-10-CM | POA: Insufficient documentation

## 2013-11-04 DIAGNOSIS — Y9269 Other specified industrial and construction area as the place of occurrence of the external cause: Secondary | ICD-10-CM | POA: Diagnosis not present

## 2013-11-04 DIAGNOSIS — F17200 Nicotine dependence, unspecified, uncomplicated: Secondary | ICD-10-CM | POA: Diagnosis not present

## 2013-11-04 DIAGNOSIS — Z8619 Personal history of other infectious and parasitic diseases: Secondary | ICD-10-CM | POA: Diagnosis not present

## 2013-11-04 DIAGNOSIS — Z79899 Other long term (current) drug therapy: Secondary | ICD-10-CM | POA: Insufficient documentation

## 2013-11-04 MED ORDER — ONDANSETRON HCL 4 MG PO TABS: 4.0000 mg | ORAL_TABLET | Freq: Three times a day (TID) | ORAL | Status: DC | PRN

## 2013-11-04 MED ORDER — ONDANSETRON 4 MG PO TBDP
4.0000 mg | ORAL_TABLET | Freq: Once | ORAL | Status: AC
  Administered 2013-11-04: 4 mg via ORAL
  Filled 2013-11-04: qty 1

## 2013-11-04 MED ORDER — MECLIZINE HCL 25 MG PO TABS
25.0000 mg | ORAL_TABLET | Freq: Once | ORAL | Status: AC
  Administered 2013-11-04: 25 mg via ORAL
  Filled 2013-11-04: qty 1

## 2013-11-04 MED ORDER — MECLIZINE HCL 25 MG PO TABS: 25.0000 mg | ORAL_TABLET | Freq: Three times a day (TID) | ORAL | Status: DC | PRN

## 2013-11-04 NOTE — ED Notes (Signed)
MD at bedside. 

## 2013-11-04 NOTE — ED Notes (Signed)
Pt hit the top of her head on a metal shelf this afternoon.  Sts it stunned her and she felt nauseous.  Sts she has been sleepy and nauseous ever since.  Sts she feels almost "drunk".

## 2013-11-04 NOTE — Discharge Instructions (Signed)
Concussion  A concussion, or closed-head injury, is a brain injury caused by a direct blow to the head or by a quick and sudden movement (jolt) of the head or neck. Concussions are usually not life-threatening. Even so, the effects of a concussion can be serious. If you have had a concussion before, you are more likely to experience concussion-like symptoms after a direct blow to the head.   CAUSES  · Direct blow to the head, such as from running into another player during a soccer game, being hit in a fight, or hitting your head on a hard surface.  · A jolt of the head or neck that causes the brain to move back and forth inside the skull, such as in a car crash.  SIGNS AND SYMPTOMS  The signs of a concussion can be hard to notice. Early on, they may be missed by you, family members, and health care providers. You may look fine but act or feel differently.  Symptoms are usually temporary, but they may last for days, weeks, or even longer. Some symptoms may appear right away while others may not show up for hours or days. Every head injury is different. Symptoms include:  · Mild to moderate headaches that will not go away.  · A feeling of pressure inside your head.  · Having more trouble than usual:  ¨ Learning or remembering things you have heard.  ¨ Answering questions.  ¨ Paying attention or concentrating.  ¨ Organizing daily tasks.  ¨ Making decisions and solving problems.  · Slowness in thinking, acting or reacting, speaking, or reading.  · Getting lost or being easily confused.  · Feeling tired all the time or lacking energy (fatigued).  · Feeling drowsy.  · Sleep disturbances.  ¨ Sleeping more than usual.  ¨ Sleeping less than usual.  ¨ Trouble falling asleep.  ¨ Trouble sleeping (insomnia).  · Loss of balance or feeling lightheaded or dizzy.  · Nausea or vomiting.  · Numbness or tingling.  · Increased sensitivity to:  ¨ Sounds.  ¨ Lights.  ¨ Distractions.  · Vision problems or eyes that tire  easily.  · Diminished sense of taste or smell.  · Ringing in the ears.  · Mood changes such as feeling sad or anxious.  · Becoming easily irritated or angry for little or no reason.  · Lack of motivation.  · Seeing or hearing things other people do not see or hear (hallucinations).  DIAGNOSIS  Your health care provider can usually diagnose a concussion based on a description of your injury and symptoms. He or she will ask whether you passed out (lost consciousness) and whether you are having trouble remembering events that happened right before and during your injury.  Your evaluation might include:  · A brain scan to look for signs of injury to the brain. Even if the test shows no injury, you may still have a concussion.  · Blood tests to be sure other problems are not present.  TREATMENT  · Concussions are usually treated in an emergency department, in urgent care, or at a clinic. You may need to stay in the hospital overnight for further treatment.  · Tell your health care provider if you are taking any medicines, including prescription medicines, over-the-counter medicines, and natural remedies. Some medicines, such as blood thinners (anticoagulants) and aspirin, may increase the chance of complications. Also tell your health care provider whether you have had alcohol or are taking illegal drugs. This information   may affect treatment.  · Your health care provider will send you home with important instructions to follow.  · How fast you will recover from a concussion depends on many factors. These factors include how severe your concussion is, what part of your brain was injured, your age, and how healthy you were before the concussion.  · Most people with mild injuries recover fully. Recovery can take time. In general, recovery is slower in older persons. Also, persons who have had a concussion in the past or have other medical problems may find that it takes longer to recover from their current injury.  HOME  CARE INSTRUCTIONS  General Instructions  · Carefully follow the directions your health care provider gave you.  · Only take over-the-counter or prescription medicines for pain, discomfort, or fever as directed by your health care provider.  · Take only those medicines that your health care provider has approved.  · Do not drink alcohol until your health care provider says you are well enough to do so. Alcohol and certain other drugs may slow your recovery and can put you at risk of further injury.  · If it is harder than usual to remember things, write them down.  · If you are easily distracted, try to do one thing at a time. For example, do not try to watch TV while fixing dinner.  · Talk with family members or close friends when making important decisions.  · Keep all follow-up appointments. Repeated evaluation of your symptoms is recommended for your recovery.  · Watch your symptoms and tell others to do the same. Complications sometimes occur after a concussion. Older adults with a brain injury may have a higher risk of serious complications, such as a blood clot on the brain.  · Tell your teachers, school nurse, school counselor, coach, athletic trainer, or work manager about your injury, symptoms, and restrictions. Tell them about what you can or cannot do. They should watch for:  ¨ Increased problems with attention or concentration.  ¨ Increased difficulty remembering or learning new information.  ¨ Increased time needed to complete tasks or assignments.  ¨ Increased irritability or decreased ability to cope with stress.  ¨ Increased symptoms.  · Rest. Rest helps the brain to heal. Make sure you:  ¨ Get plenty of sleep at night. Avoid staying up late at night.  ¨ Keep the same bedtime hours on weekends and weekdays.  ¨ Rest during the day. Take daytime naps or rest breaks when you feel tired.  · Limit activities that require a lot of thought or concentration. These include:  ¨ Doing homework or job-related  work.  ¨ Watching TV.  ¨ Working on the computer.  · Avoid any situation where there is potential for another head injury (football, hockey, soccer, basketball, martial arts, downhill snow sports and horseback riding). Your condition will get worse every time you experience a concussion. You should avoid these activities until you are evaluated by the appropriate follow-up health care providers.  Returning To Your Regular Activities  You will need to return to your normal activities slowly, not all at once. You must give your body and brain enough time for recovery.  · Do not return to sports or other athletic activities until your health care provider tells you it is safe to do so.  · Ask your health care provider when you can drive, ride a bicycle, or operate heavy machinery. Your ability to react may be slower after a   brain injury. Never do these activities if you are dizzy.  · Ask your health care provider about when you can return to work or school.  Preventing Another Concussion  It is very important to avoid another brain injury, especially before you have recovered. In rare cases, another injury can lead to permanent brain damage, brain swelling, or death. The risk of this is greatest during the first 7-10 days after a head injury. Avoid injuries by:  · Wearing a seat belt when riding in a car.  · Drinking alcohol only in moderation.  · Wearing a helmet when biking, skiing, skateboarding, skating, or doing similar activities.  · Avoiding activities that could lead to a second concussion, such as contact or recreational sports, until your health care provider says it is okay.  · Taking safety measures in your home.  ¨ Remove clutter and tripping hazards from floors and stairways.  ¨ Use grab bars in bathrooms and handrails by stairs.  ¨ Place non-slip mats on floors and in bathtubs.  ¨ Improve lighting in dim areas.  SEEK MEDICAL CARE IF:  · You have increased problems paying attention or  concentrating.  · You have increased difficulty remembering or learning new information.  · You need more time to complete tasks or assignments than before.  · You have increased irritability or decreased ability to cope with stress.  · You have more symptoms than before.  Seek medical care if you have any of the following symptoms for more than 2 weeks after your injury:  · Lasting (chronic) headaches.  · Dizziness or balance problems.  · Nausea.  · Vision problems.  · Increased sensitivity to noise or light.  · Depression or mood swings.  · Anxiety or irritability.  · Memory problems.  · Difficulty concentrating or paying attention.  · Sleep problems.  · Feeling tired all the time.  SEEK IMMEDIATE MEDICAL CARE IF:  · You have severe or worsening headaches. These may be a sign of a blood clot in the brain.  · You have weakness (even if only in one hand, leg, or part of the face).  · You have numbness.  · You have decreased coordination.  · You vomit repeatedly.  · You have increased sleepiness.  · One pupil is larger than the other.  · You have convulsions.  · You have slurred speech.  · You have increased confusion. This may be a sign of a blood clot in the brain.  · You have increased restlessness, agitation, or irritability.  · You are unable to recognize people or places.  · You have neck pain.  · It is difficult to wake you up.  · You have unusual behavior changes.  · You lose consciousness.  MAKE SURE YOU:  · Understand these instructions.  · Will watch your condition.  · Will get help right away if you are not doing well or get worse.  Document Released: 06/30/2003 Document Revised: 04/14/2013 Document Reviewed: 10/30/2012  [employer]® Patient Information ©2015 [employer]. This information is not intended to replace advice given to you by your health care provider. Make sure you discuss any questions you have with your health care provider.

## 2013-11-04 NOTE — ED Provider Notes (Signed)
CSN: [id number]     Arrival date & time 11/04/13  2205 History   None    This chart was scribed for Hanley Seamen, MD by Arlan Organ, ED Scribe. This patient was seen in room MH10/MH10 and the patient's care was started 11:31 PM.   Chief Complaint  Patient presents with  . Head Injury   HPI  HPI Comments: Judy Walters is a 38 y.o. female who presents to the Emergency Department complaining of a head injury onset 2 PM today. Pt states she hit the top of her head on a metal shelf this afternoon while at work by standing up too fast. There was no LOC. Pt states she took a nap after returning home from work and was asleep for longer than usual. She admits to intermittent nausea but no vomiting, dizziness (vaguely described a "wooziness"), mild intermittent blurred visions, mild left-sided neck pain, and sleepiness after onset of injury. States she feels "almost drunk". Dizziness is exacerbated with sudden/certain movements. Symptoms are moderate.   Past Medical History  Diagnosis Date  . Shingles   . Post partum depression    Past Surgical History  Procedure Laterality Date  . Cesarean section    . Appendectomy    . Tonsillectomy    . Tubal ligation     Family History  Problem Relation Age of Onset  . Hypertension Mother   . Cancer Mother     breast CA  . COPD Father   . Diabetes Father   . Diabetes Sister    History  Substance Use Topics  . Smoking status: Current Every Day Smoker -- 1.00 packs/day for 15 years    Types: Cigarettes  . Smokeless tobacco: Never Used  . Alcohol Use: No   OB History   Grav Para Term Preterm Abortions TAB SAB Ect Mult Living                 Review of Systems  All other systems reviewed and are negative.     Allergies  Betadine  Home Medications   Prior to Admission medications   Medication Sig Start Date End Date Taking? Authorizing Provider  sertraline (ZOLOFT) 50 MG tablet Take 50 mg by mouth daily.   Yes Historical  Provider, MD  albuterol (PROVENTIL HFA;VENTOLIN HFA) 108 (90 BASE) MCG/ACT inhaler Inhale 2 puffs into the lungs every 4 (four) hours as needed for wheezing (cough, shortness of breath or wheezing.). 11/04/12   Doree Albee, MD  azithromycin (ZITHROMAX) 250 MG tablet Take 2 tabs PO x 1 dose, then 1 tab PO QD x 4 days 11/04/12   Doree Albee, MD  meclizine (ANTIVERT) 25 MG tablet Take 1 tablet (25 mg total) by mouth 3 (three) times daily as needed for dizziness. 11/04/13   Judy Beers Nikaya Nasby, MD  ondansetron (ZOFRAN) 4 MG tablet Take 1 tablet (4 mg total) by mouth every 8 (eight) hours as needed for nausea or vomiting. 11/04/13   Judy Beers Darbi Chandran, MD  predniSONE (DELTASONE) 50 MG tablet 1 tab daily x 5 days 11/04/12   Doree Albee, MD   Triage Vitals: BP 129/66  Pulse 65  Temp(Src) 98 F (36.7 C) (Oral)  Resp 18  Ht 5\' 5"  (1.651 m)  Wt 155 lb (70.308 kg)  BMI 25.79 kg/m2  SpO2 100%  LMP 10/28/2013   Physical Exam  General: Well-developed, well-nourished female in no acute distress; appearance consistent with age of record HENT: normocephalic; atraumatic; No hemotympanum; tenderness to palpation over  L parietal scalp; small hematoma of L parietal scalp Eyes: pupils equal, round and reactive to light; extraocular muscles intact Neck: supple; No C-spine tenderness; Mild soft tissue tenderness Heart: regular rate and rhythm; no murmurs, rubs or gallops Lungs: clear to auscultation bilaterally Abdomen: soft; nondistended; nontender; no masses or hepatosplenomegaly; bowel sounds present Extremities: No deformity; full range of motion; pulses normal Neurologic: Awake, alert and oriented; motor function intact in all extremities and symmetric; no facial droop; normal coordination and speech; normal finger to nose; negative Romberg Skin: Warm and dry Psychiatric: Normal mood and affect   ED Course  Procedures (including critical care time)  DIAGNOSTIC STUDIES: Oxygen Saturation is 100% on RA, Normal  by my interpretation.    COORDINATION OF CARE: 11:41 PM- Wil give Zofran and Antivert. Discussed treatment plan with pt at bedside and pt agreed to plan.    MDM   Final diagnoses:  Minor head injury without loss of consciousness, initial encounter    I personally performed the services described in this documentation, which was scribed in my presence. The recorded information has been reviewed and is accurate.    Hanley SeamenJohn L Kamden Stanislaw, MD 11/04/13 [phone]

## 2013-12-22 LAB — HM PAP SMEAR: HM Pap smear: NORMAL

## 2014-01-21 LAB — HM MAMMOGRAPHY: HM Mammogram: NORMAL

## 2014-10-04 ENCOUNTER — Encounter (HOSPITAL_BASED_OUTPATIENT_CLINIC_OR_DEPARTMENT_OTHER): Payer: Self-pay | Admitting: Family Medicine

## 2014-10-04 ENCOUNTER — Emergency Department (HOSPITAL_BASED_OUTPATIENT_CLINIC_OR_DEPARTMENT_OTHER)
Admission: EM | Admit: 2014-10-04 | Discharge: 2014-10-04 | Disposition: A | Discharge status: Home / Self Care | Payer: BLUE CROSS/BLUE SHIELD | Attending: Emergency Medicine | Admitting: Emergency Medicine

## 2014-10-04 DIAGNOSIS — R21 Rash and other nonspecific skin eruption: Secondary | ICD-10-CM | POA: Diagnosis present

## 2014-10-04 DIAGNOSIS — F53 Puerperal psychosis: Secondary | ICD-10-CM | POA: Diagnosis not present

## 2014-10-04 DIAGNOSIS — Z79899 Other long term (current) drug therapy: Secondary | ICD-10-CM | POA: Diagnosis not present

## 2014-10-04 DIAGNOSIS — B029 Zoster without complications: Secondary | ICD-10-CM

## 2014-10-04 DIAGNOSIS — Z720 Tobacco use: Secondary | ICD-10-CM | POA: Insufficient documentation

## 2014-10-04 MED ORDER — ACYCLOVIR 800 MG PO TABS: 800.0000 mg | ORAL_TABLET | Freq: Every day | ORAL | Status: DC

## 2014-10-04 MED ORDER — OXYCODONE-ACETAMINOPHEN 5-325 MG PO TABS: 1.0000 | ORAL_TABLET | Freq: Four times a day (QID) | ORAL | Status: DC | PRN

## 2014-10-04 NOTE — ED Notes (Signed)
Pt c/o rash that she states is shingles and that she gets shingles annually since childhood when she is stressed. She states pain started 3 days ago to left low back and then the rash appeared the following day.

## 2014-10-04 NOTE — ED Provider Notes (Signed)
CSN: [id number]     Arrival date & time 10/04/14  1045 History   First MD Initiated Contact with Patient 10/04/14 1058     Chief Complaint  Patient presents with  . Rash    HPI   39 year-old female presents today with shingles.patient reports that she had chickenpox as a child, and began having shingles outbreaks shortly after. She reports these are usually on her left lower back, generally does not cross the midline And begin with tingling sharp burning sensation in her back that develops into red erythematous rash with vesicles. She reports that she's been extremely stressed recently due to family concerns, and she was also in the sun for an extended period of time before it presented. Patient has a fracture to her left leg in a cast at the time of evaluation has no complaints about that at this time. Patient denies fever, chills, nausea vomiting, abdominal pain, rash anywhere else, trauma to the back. Patient has not tried any therapies at home.    Past Medical History  Diagnosis Date  . Shingles   . Post partum depression    Past Surgical History  Procedure Laterality Date  . Cesarean section    . Appendectomy    . Tonsillectomy    . Tubal ligation    . Carpel tunnel     Family History  Problem Relation Age of Onset  . Hypertension Mother   . Cancer Mother     breast CA  . COPD Father   . Diabetes Father   . Diabetes Sister    History  Substance Use Topics  . Smoking status: Current Every Day Smoker -- 1.00 packs/day for 15 years    Types: Cigarettes  . Smokeless tobacco: Never Used  . Alcohol Use: No   OB History    No data available     Review of Systems  All other systems reviewed and are negative.     Allergies  Betadine  Home Medications   Prior to Admission medications   Medication Sig Start Date End Date Taking? Authorizing Provider  acyclovir (ZOVIRAX) 800 MG tablet Take 1 tablet (800 mg total) by mouth 5 (five) times daily. 10/04/14   Eyvonne Mechanic, PA-C  albuterol (PROVENTIL HFA;VENTOLIN HFA) 108 (90 BASE) MCG/ACT inhaler Inhale 2 puffs into the lungs every 4 (four) hours as needed for wheezing (cough, shortness of breath or wheezing.). 11/04/12   Floydene Flock, MD  azithromycin (ZITHROMAX) 250 MG tablet Take 2 tabs PO x 1 dose, then 1 tab PO QD x 4 days 11/04/12   Floydene Flock, MD  meclizine (ANTIVERT) 25 MG tablet Take 1 tablet (25 mg total) by mouth 3 (three) times daily as needed for dizziness. 11/04/13   John Molpus, MD  ondansetron (ZOFRAN) 4 MG tablet Take 1 tablet (4 mg total) by mouth every 8 (eight) hours as needed for nausea or vomiting. 11/04/13   Paula Libra, MD  oxyCODONE-acetaminophen (PERCOCET/ROXICET) 5-325 MG per tablet Take 1 tablet by mouth every 6 (six) hours as needed for severe pain. 10/04/14   Eyvonne Mechanic, PA-C  predniSONE (DELTASONE) 50 MG tablet 1 tab daily x 5 days 11/04/12   Floydene Flock, MD  sertraline (ZOLOFT) 50 MG tablet Take 50 mg by mouth daily.    Historical Provider, MD   BP 136/85 mmHg  Pulse 86  Temp(Src) 98.4 F (36.9 C) (Oral)  Resp 16  SpO2 98%  LMP 09/20/2014 Physical Exam  Constitutional: She  is oriented to person, place, and time. She appears well-developed and well-nourished.  HENT:  Head: Normocephalic and atraumatic.  Eyes: Conjunctivae are normal. Pupils are equal, round, and reactive to light. Right eye exhibits no discharge. Left eye exhibits no discharge. No scleral icterus.  Neck: Normal range of motion. No JVD present. No tracheal deviation present.  Pulmonary/Chest: Effort normal. No stridor.  Neurological: She is alert and oriented to person, place, and time. Coordination normal.  Skin:  Erythematous rash multiple vesicles left lower back does not cross the midline  Psychiatric: She has a normal mood and affect. Her behavior is normal. Judgment and thought content normal.  Nursing note and vitals reviewed.   ED Course  Procedures (including critical care  time) Labs Review Labs Reviewed - No data to display  Imaging Review No results found.   EKG Interpretation None      MDM   Final diagnoses:  Shingles    Labs: none  Imaging: none  Consults:none  Therapeutics: none  Assessment: shingles  Plan: Pt presents to the ED with a rash consistent with shingles. Patient has a history of the same, no systemic symptoms, otherwise feeling well. Patient will be treated with acyclovir, given a short course of oral pain medication. Given strict return precautions, verbalized understanding. Today's plan and no further questions concerns.      Eyvonne Mechanic, PA-C 10/05/14 1252  Mirian Mo, MD 10/07/14 [phone]

## 2014-10-04 NOTE — Discharge Instructions (Signed)
Shingles Shingles (herpes zoster) is an infection that is caused by the same virus that causes chickenpox (varicella). The infection causes a painful skin rash and fluid-filled blisters, which eventually break open, crust over, and heal. It may occur in any area of the body, but it usually affects only one side of the body or face. The pain of shingles usually lasts about 1 month. However, some people with shingles may develop long-term (chronic) pain in the affected area of the body. Shingles often occurs many years after the person had chickenpox. It is more common:  In people older than 50 years.  In people with weakened immune systems, such as those with HIV, AIDS, or cancer.  In people taking medicines that weaken the immune system, such as transplant medicines.  In people under great stress. CAUSES  Shingles is caused by the varicella zoster virus (VZV), which also causes chickenpox. After a person is infected with the virus, it can remain in the person's body for years in an inactive state (dormant). To cause shingles, the virus reactivates and breaks out as an infection in a nerve root. The virus can be spread from person to person (contagious) through contact with open blisters of the shingles rash. It will only spread to people who have not had chickenpox. When these people are exposed to the virus, they may develop chickenpox. They will not develop shingles. Once the blisters scab over, the person is no longer contagious and cannot spread the virus to others. SIGNS AND SYMPTOMS  Shingles shows up in stages. The initial symptoms may be pain, itching, and tingling in an area of the skin. This pain is usually described as burning, stabbing, or throbbing.In a few days or weeks, a painful red rash will appear in the area where the pain, itching, and tingling were felt. The rash is usually on one side of the body in a band or belt-like pattern. Then, the rash usually turns into fluid-filled  blisters. They will scab over and dry up in approximately 2-3 weeks. Flu-like symptoms may also occur with the initial symptoms, the rash, or the blisters. These may include:  Fever.  Chills.  Headache.  Upset stomach. DIAGNOSIS  Your health care provider will perform a skin exam to diagnose shingles. Skin scrapings or fluid samples may also be taken from the blisters. This sample will be examined under a microscope or sent to a lab for further testing. TREATMENT  There is no specific cure for shingles. Your health care provider will likely prescribe medicines to help you manage the pain, recover faster, and avoid long-term problems. This may include antiviral drugs, anti-inflammatory drugs, and pain medicines. HOME CARE INSTRUCTIONS   Take a cool bath or apply cool compresses to the area of the rash or blisters as directed. This may help with the pain and itching.   Take medicines only as directed by your health care provider.   Rest as directed by your health care provider.  Keep your rash and blisters clean with mild soap and cool water or as directed by your health care provider.  Do not pick your blisters or scratch your rash. Apply an anti-itch cream or numbing creams to the affected area as directed by your health care provider.  Keep your shingles rash covered with a loose bandage (dressing).  Avoid skin contact with:  Babies.   Pregnant women.   Children with eczema.   Elderly people with transplants.   People with chronic illnesses, such as leukemia  or AIDS.   Wear loose-fitting clothing to help ease the pain of material rubbing against the rash.  Keep all follow-up visits as directed by your health care provider.If the area involved is on your face, you may receive a referral for a specialist, such as an eye doctor (ophthalmologist) or an ear, nose, and throat (ENT) doctor. Keeping all follow-up visits will help you avoid eye problems, chronic pain, or  disability.  SEEK IMMEDIATE MEDICAL CARE IF:   You have facial pain, pain around the eye area, or loss of feeling on one side of your face.  You have ear pain or ringing in your ear.  You have loss of taste.  Your pain is not relieved with prescribed medicines.   Your redness or swelling spreads.   You have more pain and swelling.  Your condition is worsening or has changed.   You have a fever. MAKE SURE YOU:  Understand these instructions.  Will watch your condition.  Will get help right away if you are not doing well or get worse. Document Released: 04/09/2005 Document Revised: 08/24/2013 Document Reviewed: 11/22/2011 [facility] Patient Information 2015 [place], [place]. This information is not intended to replace advice given to you by your health care provider. Make sure you discuss any questions you have with your health care provider.  Please use medication as directed, do not drink drive or operate heavy machinery while taking narcotic pain medication. Please follow-up with [facility] her primary care provider in one week if symptoms continue to persist, follow-up sooner as needed if new or worsening signs or symptoms present

## 2014-12-01 ENCOUNTER — Encounter (HOSPITAL_BASED_OUTPATIENT_CLINIC_OR_DEPARTMENT_OTHER): Payer: Self-pay | Admitting: *Deleted

## 2014-12-01 ENCOUNTER — Emergency Department (HOSPITAL_BASED_OUTPATIENT_CLINIC_OR_DEPARTMENT_OTHER)
Admission: EM | Admit: 2014-12-01 | Discharge: 2014-12-01 | Disposition: A | Discharge status: Home / Self Care | Payer: BLUE CROSS/BLUE SHIELD | Attending: Emergency Medicine | Admitting: Emergency Medicine

## 2014-12-01 DIAGNOSIS — Z8659 Personal history of other mental and behavioral disorders: Secondary | ICD-10-CM | POA: Diagnosis not present

## 2014-12-01 DIAGNOSIS — Z8619 Personal history of other infectious and parasitic diseases: Secondary | ICD-10-CM | POA: Diagnosis not present

## 2014-12-01 DIAGNOSIS — R42 Dizziness and giddiness: Secondary | ICD-10-CM | POA: Insufficient documentation

## 2014-12-01 DIAGNOSIS — Z720 Tobacco use: Secondary | ICD-10-CM | POA: Diagnosis not present

## 2014-12-01 DIAGNOSIS — Z79899 Other long term (current) drug therapy: Secondary | ICD-10-CM | POA: Diagnosis not present

## 2014-12-01 DIAGNOSIS — H538 Other visual disturbances: Secondary | ICD-10-CM | POA: Diagnosis not present

## 2014-12-01 DIAGNOSIS — R519 Headache, unspecified: Secondary | ICD-10-CM

## 2014-12-01 DIAGNOSIS — R51 Headache: Secondary | ICD-10-CM | POA: Insufficient documentation

## 2014-12-01 MED ORDER — METOCLOPRAMIDE HCL 5 MG/ML IJ SOLN
10.0000 mg | Freq: Once | INTRAMUSCULAR | Status: AC
  Administered 2014-12-01: 10 mg via INTRAVENOUS
  Filled 2014-12-01: qty 2

## 2014-12-01 MED ORDER — DIPHENHYDRAMINE HCL 50 MG/ML IJ SOLN
50.0000 mg | Freq: Once | INTRAMUSCULAR | Status: AC
  Administered 2014-12-01: 50 mg via INTRAVENOUS
  Filled 2014-12-01: qty 1

## 2014-12-01 NOTE — Discharge Instructions (Signed)

## 2014-12-01 NOTE — ED Notes (Signed)
Presents with blurred vision, primarily left eye

## 2014-12-01 NOTE — ED Notes (Signed)
FAST: negative 

## 2014-12-01 NOTE — ED Provider Notes (Signed)
CSN: [id number]     Arrival date & time 12/01/14  1002 History   First MD Initiated Contact with Patient 12/01/14 1035     Chief Complaint  Patient presents with  . Blurred Vision     Patient is a 38 y.o. female presenting with eye problem. The history is provided by the patient.  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  No visual loss.  No focal arm/leg weakness She reports mild HA No cp/abd pain No trauma to eyes She wears contact lenses No previous eye surgery  All visual complaints resolved   No h/o stroke No recent head/neck injury  Past Medical History  Diagnosis Date  . Shingles   . Post partum depression    Past Surgical History  Procedure Laterality Date  . Cesarean section    . Appendectomy    . Tonsillectomy    . Tubal ligation    . Carpel tunnel     Family History  Problem Relation Age of Onset  . Hypertension Mother   . Cancer Mother     breast CA  . COPD Father   . Diabetes Father   . Diabetes Sister    Social History  Substance Use Topics  . Smoking status: Current Every Day Smoker -- 1.00 packs/day for 15 years    Types: Cigarettes  . Smokeless tobacco: Never Used  . Alcohol Use: No   OB History    No data available     Review of Systems  Constitutional: Negative for fever.  Eyes: Positive for blurred vision and visual disturbance.  Respiratory:  Negative for shortness of breath.   Cardiovascular: Negative for chest pain.  Gastrointestinal: Negative for vomiting.  Neurological: Positive for light-headedness and headaches. Negative for syncope.  All other systems reviewed and are negative.     Allergies  Betadine  Home Medications   Prior to Admission medications   Medication Sig Start Date End Date Taking? Authorizing Provider  albuterol (PROVENTIL HFA;VENTOLIN HFA) 108 (90 BASE) MCG/ACT inhaler Inhale 2 puffs into the lungs every 4 (four) hours as needed for wheezing (cough, shortness of breath or wheezing.). 11/04/12   Floydene Flock, MD  meclizine (ANTIVERT) 25 MG tablet Take 1 tablet (25 mg total) by mouth 3 (three) times daily as needed for dizziness. 11/04/13   John Molpus, MD  ondansetron (ZOFRAN) 4 MG tablet Take 1 tablet (4 mg total) by mouth every 8 (eight) hours as needed for nausea or vomiting. 11/04/13   Paula Libra, MD  predniSONE (DELTASONE) 50 MG tablet 1 tab daily x 5 days 11/04/12   Floydene Flock, MD  sertraline (ZOLOFT) 50 MG tablet Take 50 mg by mouth daily.    Historical Provider, MD   BP 135/76 mmHg  Pulse 64  Temp(Src) 98.3 F (36.8 C) (Oral)  Resp 18  Ht 5\' 5"  (1.651 m)  Wt 160 lb (72.576 kg)  BMI 26.63 kg/m2  SpO2 100% Physical Exam CONSTITUTIONAL: Well developed/well nourished HEAD: Normocephalic/atraumatic EYES: EOMI/PERRL, no nystagmus, no visual field deficit  no ptosis, no papilledema, fundoscopic exam does not reveal any acute pathology (CRVO, detachment) ENMT: Mucous membranes moist NECK: supple no meningeal signs, no bruits CV: S1/S2 noted, no murmurs/rubs/gallops noted LUNGS: Lungs are clear to auscultation bilaterally, no apparent distress ABDOMEN: soft, nontender, no rebound or guarding GU:no cva tenderness NEURO:Awake/alert, facies symmetric, no arm or leg drift is noted Equal 5/5 strength with shoulder abduction, elbow flex/extension, wrist flex/extension in upper extremities and  equal hand grips bilaterally Equal 5/5 strength with hip flexion,knee flex/extension, foot dorsi/plantar flexion Cranial nerves 3/4/5/6/10/29/08/11/12 tested and intact Gait normal without ataxia No past pointing Sensation to light touch intact in all extremities EXTREMITIES: pulses normal, full ROM SKIN: warm, color normal PSYCH: no abnormalities of mood noted  ED Course  Procedures  Pt reports most of her symptoms improved Visual acuity appropriate Now having mild HA This could be related to possible migraine (reports distant h/o migraine) She is well appearing No neuro deficits She was seen in 2013 for possible TIA and had negative CT/MR imaging/carotids)  I feel she is safe for d/c home She is improved I have low suspicion for TIA/CVA We discussed strict return precautions Refer to eye as outpatient for full exam, but retinal detachment unlikely   MDM   Final diagnoses:  Blurred vision  Nonintractable headache, unspecified chronicity pattern, unspecified headache type    Nursing notes including past medical history and social history reviewed and considered in documentation     Zadie Rhine, MD 12/01/14 1251

## 2014-12-01 NOTE — ED Notes (Signed)
States has vision disturbance in left eye with onset approx 0900 hrs today, denies any nausea or vomiting with this episode

## 2015-03-15 ENCOUNTER — Telehealth: Payer: Self-pay | Admitting: Behavioral Health

## 2015-03-15 ENCOUNTER — Encounter: Payer: Self-pay | Admitting: Behavioral Health

## 2015-03-15 NOTE — Telephone Encounter (Signed)
Pre-Visit Call completed with patient and chart updated.   Pre-Visit Info documented in Specialty Comments under [employer].    

## 2015-03-16 ENCOUNTER — Ambulatory Visit (INDEPENDENT_AMBULATORY_CARE_PROVIDER_SITE_OTHER): Payer: BLUE CROSS/BLUE SHIELD | Admitting: Physician Assistant

## 2015-03-16 ENCOUNTER — Encounter: Payer: Self-pay | Admitting: Physician Assistant

## 2015-03-16 VITALS — BP 136/78 | HR 64 | Temp 98.2°F | Resp 16 | Ht 66.5 in | Wt 175.5 lb

## 2015-03-16 DIAGNOSIS — F32A Depression, unspecified: Secondary | ICD-10-CM

## 2015-03-16 DIAGNOSIS — F329 Major depressive disorder, single episode, unspecified: Secondary | ICD-10-CM

## 2015-03-16 DIAGNOSIS — R519 Headache, unspecified: Secondary | ICD-10-CM

## 2015-03-16 DIAGNOSIS — R51 Headache: Secondary | ICD-10-CM

## 2015-03-16 LAB — COMPREHENSIVE METABOLIC PANEL
ALBUMIN: 4.4 g/dL (ref 3.5–5.2)
ALK PHOS: 77 U/L (ref 39–117)
ALT: 14 U/L (ref 0–35)
AST: 16 U/L (ref 0–37)
BILIRUBIN TOTAL: 0.4 mg/dL (ref 0.2–1.2)
BUN: 16 mg/dL (ref 6–23)
CO2: 27 mEq/L (ref 19–32)
CREATININE: 0.58 mg/dL (ref 0.40–1.20)
Calcium: 9.5 mg/dL (ref 8.4–10.5)
Chloride: 108 mEq/L (ref 96–112)
GFR: 121.52 mL/min (ref >60.00)
Glucose, Bld: 90 mg/dL (ref 70–99)
POTASSIUM: 4 meq/L (ref 3.5–5.1)
SODIUM: 142 meq/L (ref 135–145)
TOTAL PROTEIN: 7.2 g/dL (ref 6.0–8.3)

## 2015-03-16 LAB — CBC
HEMATOCRIT: 41.1 % (ref 36.0–46.0)
Hemoglobin: 13.1 g/dL (ref 12.0–15.0)
MCHC: 31.8 g/dL (ref 30.0–36.0)
MCV: 86.4 fl (ref 78.0–100.0)
Platelets: 220 10*3/uL (ref 150.0–400.0)
RBC: 4.76 Mil/uL (ref 3.87–5.11)
RDW: 13.9 % (ref 11.5–15.5)
WBC: 6.9 10*3/uL (ref 4.0–10.5)

## 2015-03-16 LAB — TSH: TSH: 4.24 u[IU]/mL (ref 0.35–4.50)

## 2015-03-16 LAB — LIPID PANEL
Cholesterol: 147 mg/dL (ref 0–200)
HDL: 61.8 mg/dL (ref >39.00)
LDL Cholesterol: 73 mg/dL (ref 0–99)
NONHDL: 85.45
Total CHOL/HDL Ratio: 2
Triglycerides: 64 mg/dL (ref 0.0–149.0)
VLDL: 12.8 mg/dL (ref 0.0–40.0)

## 2015-03-16 LAB — VITAMIN D 25 HYDROXY (VIT D DEFICIENCY, FRACTURES): VITD: 21.4 ng/mL — AB (ref 30.00–100.00)

## 2015-03-16 LAB — VITAMIN B12: VITAMIN B 12: 456 pg/mL (ref 211–911)

## 2015-03-16 MED ORDER — ESCITALOPRAM OXALATE 10 MG PO TABS: 10.0000 mg | ORAL_TABLET | Freq: Every day | ORAL | Status: DC

## 2015-03-16 MED ORDER — ALPRAZOLAM 0.25 MG PO TABS: 0.2500 mg | ORAL_TABLET | Freq: Two times a day (BID) | ORAL | Status: DC | PRN

## 2015-03-16 NOTE — Patient Instructions (Signed)
Please go to the lab for blood work. I will call you with your results. Start the Lexapro 10 mg daily. Use xanax as directed for severe anxiety. Start an 81 mg aspirin daily. You will be contacted for assessment by Neurology.  Follow-up with me in 1 month.

## 2015-03-16 NOTE — Progress Notes (Signed)
Pre visit review using our clinic review tool, if applicable. No additional management support is needed unless otherwise documented below in the visit note/SLS  

## 2015-03-18 DIAGNOSIS — R51 Headache: Secondary | ICD-10-CM

## 2015-03-18 DIAGNOSIS — F329 Major depressive disorder, single episode, unspecified: Secondary | ICD-10-CM | POA: Insufficient documentation

## 2015-03-18 DIAGNOSIS — F32A Depression, unspecified: Secondary | ICD-10-CM | POA: Insufficient documentation

## 2015-03-18 DIAGNOSIS — R519 Headache, unspecified: Secondary | ICD-10-CM | POA: Insufficient documentation

## 2015-03-18 NOTE — Assessment & Plan Note (Signed)
Will begin Lexapro 10 mg daily. Will add-on Xanax PRN for emergencies. Counseling recommended. Patient will give some thought. CBC, CMP, TSH obtained today. Follow-up 1 month.

## 2015-03-18 NOTE — Assessment & Plan Note (Signed)
Associated with symptoms that seem anxiety-related. Prior negative imaging. Questionable TIA history. Will check CMP, Lipid Panel today. Start 81 mg ASA. Neurology referral placed.

## 2015-03-18 NOTE — Progress Notes (Signed)
Patient presents to clinic today to establish care.  Patient endorses significant anxiety and depressed mood. Was previously on medication several years prior but was sub-therapeutic. Patient believes medication was sertraline. Denies SI/HI. Endorses rare panic attack. Would like to discuss options for treatment.  Patient also endorses episode of tingling and numbness of extremities intermittently over the past 3 years. Patient endorses prior diagnosis of TIA. Denies symptoms at present. Notes some correlation between anxiety and these symptoms. Notes headaches when symptoms are present. Has had negative imaging in ER prior.  Past Medical History  Diagnosis Date  . Shingles     3 at onset  . 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 History of chicken pox   . Asthma   . Depression   . Heart murmur   . Syncope   . Frequent headaches   . TIA (transient ischemic attack) 2013    Current Outpatient Prescriptions on File Prior to Visit  Medication Sig Dispense Refill  . Linaclotide (LINZESS) 290 MCG CAPS capsule 1 tablet once daily.     No current facility-administered medications on file prior to visit.    Allergies  Allergen Reactions  . Betadine [Povidone Iodine] Itching and Rash    Family History  Problem Relation Age of Onset  . Hypertension Mother     Living  . Breast cancer Mother   . Leukemia Mother   . COPD Father   . Diabetes Father   . Diabetes Sister   . Heart disease Father 42    Deceased  . Bipolar disorder Mother   . Colon cancer Maternal Grandfather   . Lung cancer Paternal Grandmother   . Pancreatic cancer Paternal Uncle   . COPD Father   . COPD Paternal Uncle   . Congestive Heart Failure Father   . Congestive Heart Failure Paternal Uncle   . Heart defect Paternal Uncle   . Stroke Paternal Uncle   . Breast cancer Maternal Aunt     x2  . Stroke Maternal Aunt   . Lung cancer Maternal Aunt   . Diabetes Sister   . Obesity Sister   .  Diabetes Father   . Diabetes Paternal Grandmother   . Diabetes Other     Paternal-side  . Heart murmur Son   . Healthy Daughter   . Asthma Son     #2  . Seizures Son     #2  . Behavior problems Son     #2    Social History   Social History  . Marital Status: Single    Spouse Name: N/A  . Number of Children: 5  . Years of Education: N/A   Social History Main Topics  . Smoking status: Current Every Day Smoker -- 1.00 packs/day for 15 years    Types: Cigarettes  . Smokeless tobacco: Never Used  . Alcohol Use: No  . Drug Use: No  . Sexual Activity: Yes    Birth Control/ Protection: Surgical   Other Topics Concern  . None   Social History Narrative   Review of Systems  Constitutional: Negative for fever and malaise/fatigue.  Eyes: Negative for blurred vision and double vision.  Cardiovascular: Negative for chest pain and palpitations.  Neurological: Positive for headaches. Negative for dizziness, tingling, sensory change and loss of consciousness.  Psychiatric/Behavioral: Positive for depression. Negative for suicidal ideas, hallucinations and substance abuse. The patient is nervous/anxious. The patient does not have insomnia.  BP 136/78 mmHg  Pulse 64  Temp(Src) 98.2 F (36.8 C) (Oral)  Resp 16  Ht 5' 6.5" (1.689 m)  Wt 175 lb 8 oz (79.606 kg)  BMI 27.91 kg/m2  SpO2 100%  LMP 03/04/2015  Physical Exam  Constitutional: She is oriented to person, place, and time and well-developed, well-nourished, and in no distress.  HENT:  Head: Normocephalic and atraumatic.  Right Ear: External ear normal.  Left Ear: External ear normal.  Nose: Nose normal.  Mouth/Throat: Oropharynx is clear and moist. No oropharyngeal exudate.  Eyes: Conjunctivae are normal. Pupils are equal, round, and reactive to light.  Neck: Neck supple. No thyromegaly present.  Cardiovascular: Normal rate, regular rhythm, normal heart sounds and intact distal pulses.   Pulmonary/Chest: Effort  normal and breath sounds normal. No respiratory distress. She has no wheezes. She has no rales. She exhibits no tenderness.  Neurological: She is alert and oriented to person, place, and time.  Skin: Skin is warm and dry. No rash noted.  Psychiatric: Her mood appears anxious.  Vitals reviewed.   Recent Results (from the past 2160 hour(s))  CBC     Status: None   Collection Time: 03/16/15  9:43 AM  Result Value Ref Range   WBC 6.9 4.0 - 10.5 K/uL   RBC 4.76 3.87 - 5.11 Mil/uL   Platelets 220.0 150.0 - 400.0 K/uL   Hemoglobin 13.1 12.0 - 15.0 g/dL   HCT 41.1 36.0 - 46.0 %   MCV 86.4 78.0 - 100.0 fl   MCHC 31.8 30.0 - 36.0 g/dL   RDW 13.9 11.5 - 15.5 %  Comp Met (CMET)     Status: None   Collection Time: 03/16/15  9:43 AM  Result Value Ref Range   Sodium 142 135 - 145 mEq/L   Potassium 4.0 3.5 - 5.1 mEq/L   Chloride 108 96 - 112 mEq/L   CO2 27 19 - 32 mEq/L   Glucose, Bld 90 70 - 99 mg/dL   BUN 16 6 - 23 mg/dL   Creatinine, Ser 0.58 0.40 - 1.20 mg/dL   Total Bilirubin 0.4 0.2 - 1.2 mg/dL   Alkaline Phosphatase 77 39 - 117 U/L   AST 16 0 - 37 U/L   ALT 14 0 - 35 U/L   Total Protein 7.2 6.0 - 8.3 g/dL   Albumin 4.4 3.5 - 5.2 g/dL   Calcium 9.5 8.4 - 10.5 mg/dL   GFR 121.52 >60.00 mL/min  TSH     Status: None   Collection Time: 03/16/15  9:43 AM  Result Value Ref Range   TSH 4.24 0.35 - 4.50 uIU/mL  Lipid Profile     Status: None   Collection Time: 03/16/15  9:43 AM  Result Value Ref Range   Cholesterol 147 0 - 200 mg/dL    Comment: ATP III Classification       Desirable:  < 200 mg/dL               Borderline High:  200 - 239 mg/dL          High:  > = 240 mg/dL   Triglycerides 64.0 0.0 - 149.0 mg/dL    Comment: Normal:  <150 mg/dLBorderline High:  150 - 199 mg/dL   HDL 61.80 >39.00 mg/dL   VLDL 12.8 0.0 - 40.0 mg/dL   LDL Cholesterol 73 0 - 99 mg/dL   Total CHOL/HDL Ratio 2     Comment:  Men          Women1/2 Average Risk     3.4          3.3Average Risk           5.0          4.42X Average Risk          9.6          7.13X Average Risk          15.0          11.0                       NonHDL 85.45     Comment: NOTE:  Non-HDL goal should be 30 mg/dL higher than patient's LDL goal (i.e. LDL goal of < 70 mg/dL, would have non-HDL goal of < 100 mg/dL)  Vitamin D (25 hydroxy)     Status: Abnormal   Collection Time: 03/16/15  9:43 AM  Result Value Ref Range   VITD 21.40 (L) 30.00 - 100.00 ng/mL  B12     Status: None   Collection Time: 03/16/15  9:43 AM  Result Value Ref Range   Vitamin B-12 456 211 - 911 pg/mL    Assessment/Plan: Depression Will begin Lexapro 10 mg daily. Will add-on Xanax PRN for emergencies. Counseling recommended. Patient will give some thought. CBC, CMP, TSH obtained today. Follow-up 1 month.  Severe frontal headaches Associated with symptoms that seem anxiety-related. Prior negative imaging. Questionable TIA history. Will check CMP, Lipid Panel today. Start 81 mg ASA. Neurology referral placed.

## 2015-03-23 ENCOUNTER — Ambulatory Visit (INDEPENDENT_AMBULATORY_CARE_PROVIDER_SITE_OTHER): Payer: BLUE CROSS/BLUE SHIELD | Admitting: Neurology

## 2015-03-23 ENCOUNTER — Encounter: Payer: Self-pay | Admitting: Neurology

## 2015-03-23 VITALS — BP 104/76 | HR 69 | Ht 65.0 in | Wt 172.5 lb

## 2015-03-23 DIAGNOSIS — G43009 Migraine without aura, not intractable, without status migrainosus: Secondary | ICD-10-CM | POA: Diagnosis not present

## 2015-03-23 DIAGNOSIS — R51 Headache: Secondary | ICD-10-CM

## 2015-03-23 DIAGNOSIS — F32A Depression, unspecified: Secondary | ICD-10-CM

## 2015-03-23 DIAGNOSIS — F419 Anxiety disorder, unspecified: Secondary | ICD-10-CM

## 2015-03-23 DIAGNOSIS — R519 Headache, unspecified: Secondary | ICD-10-CM

## 2015-03-23 DIAGNOSIS — F329 Major depressive disorder, single episode, unspecified: Secondary | ICD-10-CM

## 2015-03-23 MED ORDER — VENLAFAXINE HCL ER 37.5 MG PO CP24: ORAL_CAPSULE | ORAL | Status: DC

## 2015-03-23 NOTE — Progress Notes (Signed)
NEUROLOGY CONSULTATION NOTE  Judy Walters MRN: [id number] DOB: 1975-10-27  Referring provider: Waldon MerlWilliam C. Martin, PA-C Primary care provider: Waldon MerlWilliam C. Martin, PA-C  Reason for consult:  headaches  HISTORY OF PRESENT ILLNESS: Judy Walters is a 39 year old right-handed female with depression, anxiety and tobacco abuse who presents for headache.  History obtained by patient and PCP note.  Labs and imaging of prior MRI and MRA of head reviewed.  She presents for several issues.  All symptoms started about 3 years ago.   She has headaches.  At time of initial presentation, they are pounding 7-8/10 left frontal pain, lasting 30 to 60 minutes.  They may recur later in the day.  There is no associated nausea, photophobia or phonophobia.  She notes seeing floaters.  On one occasion, she said she lost complete vision for about 10 minutes.  She treats headaches with ibuprofen, which she takes about 3 days a week.  Rarely, she will take Goodys.  She also has episodes of weakness and confusion.  They occur with the headaches but also occur separately.  She reports diffuse weakness, especially of the extremities.  She feels like her arms and legs are heavy.  She reports a soreness in the legs but no cramps or muscle spasms.  She also may have episodes of confusion, where she loses her train of thought in the middle of an activity or while talking in conversation.  She also has felt an out of body type of experience.  She denies epigastric rising or olfactory aura.  Often, these events occur with feeling of anxiety and overwhelmed.  She presented to the ED on 02/12/12 for similar symptoms with numbness and dizziness associated with slurred speech.  IShe had associated headache.  It occurred at work and a co-worker said that the side of her mouth was drooping.  She noted that her right leg felt heavy.  MRI and MRA of head were normal.  Carotid doppler and echo were unremarkable.  She was found to  have a mild UTI and was prescribed Flagyl.  She was told she may have had a TIA.  She was never told to start ASA.  She reports remote history of headaches in her early 6120s.  One of her children has multiple medical and behavioral problems.  Because of this, she does report significant stress and depression and has periods of feeling overwhelmed.  She does not exercise routinely.  She drinks coffee daily.  Recent CBC, CMP, B12 and TSH were normal.  LDL was 73.  Vitamin D was low (21.40).    PAST MEDICAL HISTORY: Past Medical History  Diagnosis Date  . Shingles     3 at onset  . 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 History of chicken pox   . Asthma   . Depression   . Heart murmur   . Syncope   . Frequent headaches   . TIA (transient ischemic attack) 2013    PAST SURGICAL HISTORY: Past Surgical History  Procedure Laterality Date  . Cesarean section  2012  . Appendectomy  1989  . Tonsillectomy  1985  . Tubal ligation    . Carpel tunnel  2012  . Wisdom tooth extraction      MEDICATIONS: Current Outpatient Prescriptions on File Prior to Visit  Medication Sig Dispense Refill  . ALPRAZolam (XANAX) 0.25 MG tablet Take 1 tablet (0.25 mg total) by mouth 2 (two) times daily as needed  for anxiety. 30 tablet 0  . Linaclotide (LINZESS) 290 MCG CAPS capsule 1 tablet once daily.     No current facility-administered medications on file prior to visit.    ALLERGIES: Allergies  Allergen Reactions  . Betadine [Povidone Iodine] Itching and Rash    FAMILY HISTORY: Family History  Problem Relation Age of Onset  . Hypertension Mother     Living  . Breast cancer Mother   . Leukemia Mother   . COPD Father   . Diabetes Father   . Diabetes Sister   . Heart disease Father 1    Deceased  . Bipolar disorder Mother   . Colon cancer Maternal Grandfather   . Lung cancer Paternal Grandmother   . Pancreatic cancer Paternal Uncle   . COPD Father   . COPD Paternal Uncle     . Congestive Heart Failure Father   . Congestive Heart Failure Paternal Uncle   . Heart defect Paternal Uncle   . Stroke Paternal Uncle   . Breast cancer Maternal Aunt     x2  . Stroke Maternal Aunt   . Lung cancer Maternal Aunt   . Diabetes Sister   . Obesity Sister   . Diabetes Father   . Diabetes Paternal Grandmother   . Diabetes Other     Paternal-side  . Heart murmur Son   . Healthy Daughter   . Asthma Son     #2  . Seizures Son     #2  . Behavior problems Son     #2    SOCIAL HISTORY: Social History   Social History  . Marital Status: Single    Spouse Name: N/A  . Number of Children: 5  . Years of Education: N/A   Occupational History  . Not on file.   Social History Main Topics  . Smoking status: Current Every Day Smoker -- 1.00 packs/day for 15 years    Types: Cigarettes  . Smokeless tobacco: Never Used  . Alcohol Use: No  . Drug Use: No  . Sexual Activity: Yes    Birth Control/ Protection: Surgical   Other Topics Concern  . Not on file   Social History Narrative    REVIEW OF SYSTEMS: Constitutional: No fevers, chills, or sweats, no generalized fatigue, change in appetite Eyes: No visual changes, double vision, eye pain Ear, nose and throat: No hearing loss, ear pain, nasal congestion, sore throat Cardiovascular: No chest pain, palpitations Respiratory:  No shortness of breath at rest or with exertion, wheezes GastrointestinaI: No nausea, vomiting, diarrhea, abdominal pain, fecal incontinence Genitourinary:  No dysuria, urinary retention or frequency Musculoskeletal:  No neck pain, back pain Integumentary: No rash, pruritus, skin lesions Neurological: as above Psychiatric: No depression, insomnia, anxiety Endocrine: No palpitations, fatigue, diaphoresis, mood swings, change in appetite, change in weight, increased thirst Hematologic/Lymphatic:  No anemia, purpura, petechiae. Allergic/Immunologic: no itchy/runny eyes, nasal congestion, recent  allergic reactions, rashes  PHYSICAL EXAM: Filed Vitals:   03/23/15 1020  BP: 104/76  Pulse: 69   General: No acute distress.  Patient appears well-groomed.  Head:  Normocephalic/atraumatic Eyes:  fundi unremarkable, without vessel changes, exudates, hemorrhages or papilledema. Neck: supple, no paraspinal tenderness, full range of motion Back: No paraspinal tenderness Heart: regular rate and rhythm Lungs: Clear to auscultation bilaterally. Vascular: No carotid bruits. Neurological Exam: Mental status: alert and oriented to person, place, and time, recent and remote memory intact, fund of knowledge intact, attention and concentration intact, speech fluent and not dysarthric, language  intact. Cranial nerves: CN I: not tested CN II: pupils equal, round and reactive to light, visual fields intact, fundi unremarkable, without vessel changes, exudates, hemorrhages or papilledema. CN III, IV, VI:  full range of motion, no nystagmus, no ptosis CN V: facial sensation intact CN VII: upper and lower face symmetric CN VIII: hearing intact CN IX, X: gag intact, uvula midline CN XI: sternocleidomastoid and trapezius muscles intact CN XII: tongue midline Bulk & Tone: normal, no fasciculations. Motor:  5/5 throughout Sensation:  Pinprick and vibration sensation intact. Deep Tendon Reflexes:  2+ throughout, toes downgoing.  Finger to nose testing:  Without dysmetria.  Heel to shin:  Without dysmetria.  Gait:  Normal station and stride.  Able to turn and tandem walk. Romberg negative.  IMPRESSION: She may have migraines.  The event in 2013 was likely migraine I think anxiety is playing a significant role and is likely the cause of some of her symptoms such as generalized weakness and confusion.  I don't suspect TIA or seizure  PLAN: 1.  Will discontinue Lexapro and start an antidepressant that may help with headaches as well.  We will start venlafaxine ER 37.5mg  daily for 7 days, and then  increase to  daily 2.  She will limit use of ibuprofen to no more than 2 days out of the week 3.  She will discontinue coffee, increase exercise, smoking cessation and work on sleep hygiene 4.  Follow up in 3 months.  Thank you for allowing me to take part in the care of this patient.  Shon Millet, DO  CC: Waldon Merl, PA-C

## 2015-03-23 NOTE — Patient Instructions (Signed)
I think you probably have migraines but your feeling of weakness and confusion is related to anxiety 1.  Start venlafaxine ER 37.5mg  capsules.  Take 1 capsule with breakfast daily for 7 days, then 2 capsules daily with breakfast.  Call in 5 weeks with update and we can adjust dose if needed. 2.  Stop Lexapro 3.  Limit use of pain relievers to no more than 2 days out of the week.  These medications include acetaminophen, ibuprofen, triptans and narcotics.  This will help reduce risk of rebound headaches. 4.  Be aware of common food triggers such as processed sweets, processed foods with nitrites (such as deli meat, hot dogs, sausages), foods with MSG, alcohol (such as wine), chocolate, certain cheeses, certain fruits (dried fruits, some citrus fruit), vinegar, diet soda. 4.  Avoid caffeine 5.  Routine exercise 6.  Proper sleep hygiene 7.  Stay adequately hydrated with water 8.  Keep a headache diary. 9.  Maintain proper stress management. 10.  Do not skip meals. 11.  Consider supplements:  Magnesium oxide 400mg  to 600mg  daily, riboflavin 400mg , Coenzyme Q 10 100mg  three times daily 12.  CALL IN 5 WEEKS WITH UPDATE.  Follow up in 3 months.

## 2015-04-15 ENCOUNTER — Ambulatory Visit (INDEPENDENT_AMBULATORY_CARE_PROVIDER_SITE_OTHER): Payer: BLUE CROSS/BLUE SHIELD | Admitting: Physician Assistant

## 2015-04-15 ENCOUNTER — Encounter: Payer: Self-pay | Admitting: Physician Assistant

## 2015-04-15 VITALS — BP 140/70 | HR 63 | Temp 98.3°F | Ht 65.0 in | Wt 171.0 lb

## 2015-04-15 DIAGNOSIS — J209 Acute bronchitis, unspecified: Secondary | ICD-10-CM | POA: Diagnosis not present

## 2015-04-15 DIAGNOSIS — F32A Depression, unspecified: Secondary | ICD-10-CM

## 2015-04-15 DIAGNOSIS — F329 Major depressive disorder, single episode, unspecified: Secondary | ICD-10-CM | POA: Diagnosis not present

## 2015-04-15 MED ORDER — BENZONATATE 100 MG PO CAPS: 100.0000 mg | ORAL_CAPSULE | Freq: Three times a day (TID) | ORAL | Status: DC | PRN

## 2015-04-15 MED ORDER — AZITHROMYCIN 250 MG PO TABS: ORAL_TABLET | ORAL | Status: DC

## 2015-04-15 NOTE — Progress Notes (Signed)
Pre visit review using our clinic review tool, if applicable. No additional management support is needed unless otherwise documented below in the visit note. 

## 2015-04-15 NOTE — Progress Notes (Signed)
Patient presents to clinic today c/o significant chest congestion and productive cough. Endorses chest heaviness with symptoms. Denies fever, chills. Notes fatigue. Patient has started using Mucinex and Alka Seltzer since yesterday.  Patient also here for follow-up of anxiety. Is doing very well on current regimen. Anxiety much improved. No headaches and sleeping much better. Has only taken a couple of Xanax for acute anxiety.    Past Medical History  Diagnosis Date  . Shingles     3 at onset  . 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 History of chicken pox   . Asthma   . Depression   . Heart murmur   . Syncope   . Frequent headaches   . TIA (transient ischemic attack) 2013    Current Outpatient Prescriptions on File Prior to Visit  Medication Sig Dispense Refill  . ALPRAZolam (XANAX) 0.25 MG tablet Take 1 tablet (0.25 mg total) by mouth 2 (two) times daily as needed for anxiety. 30 tablet 0  . Linaclotide (LINZESS) 290 MCG CAPS capsule 1 tablet once daily.    Marland Kitchen venlafaxine XR (EFFEXOR-XR) 37.5 MG 24 hr capsule Take 1 capsule at breakfast daily for 7 days, then 2 capsules daily at breakfast (Patient not taking: Reported on 04/15/2015) 60 capsule 0   No current facility-administered medications on file prior to visit.    Allergies  Allergen Reactions  . Betadine [Povidone Iodine] Itching and Rash    Family History  Problem Relation Age of Onset  . Hypertension Mother     Living  . Breast cancer Mother   . Leukemia Mother   . COPD Father   . Diabetes Father   . Diabetes Sister   . Heart disease Father 66    Deceased  . Bipolar disorder Mother   . Colon cancer Maternal Grandfather   . Lung cancer Paternal Grandmother   . Pancreatic cancer Paternal Uncle   . COPD Father   . COPD Paternal Uncle   . Congestive Heart Failure Father   . Congestive Heart Failure Paternal Uncle   . Heart defect Paternal Uncle   . Stroke Paternal Uncle   . Breast cancer  Maternal Aunt     x2  . Stroke Maternal Aunt   . Lung cancer Maternal Aunt   . Diabetes Sister   . Obesity Sister   . Diabetes Father   . Diabetes Paternal Grandmother   . Diabetes Other     Paternal-side  . Heart murmur Son   . Healthy Daughter   . Asthma Son     #2  . Seizures Son     #2  . Behavior problems Son     #2    Social History   Social History  . Marital Status: Single    Spouse Name: N/A  . Number of Children: 5  . Years of Education: N/A   Social History Main Topics  . Smoking status: Current Every Day Smoker -- 1.00 packs/day for 15 years    Types: Cigarettes  . Smokeless tobacco: Never Used  . Alcohol Use: No  . Drug Use: No  . Sexual Activity: Yes    Birth Control/ Protection: Surgical   Other Topics Concern  . None   Social History Narrative    Review of Systems - See HPI.  All other ROS are negative.  BP 140/70 mmHg  Pulse 63  Temp(Src) 98.3 F (36.8 C) (Oral)  Ht '5\' 5"'  (1.651  m)  Wt 171 lb (77.565 kg)  BMI 28.46 kg/m2  SpO2 99%  LMP 04/05/2015  Physical Exam  Constitutional: She is oriented to person, place, and time and well-developed, well-nourished, and in no distress.  HENT:  Head: Normocephalic and atraumatic.  Right Ear: Tympanic membrane normal.  Left Ear: Tympanic membrane normal.  Nose: Nose normal.  Mouth/Throat: Uvula is midline, oropharynx is clear and moist and mucous membranes are normal.  Eyes: Conjunctivae are normal.  Neck: Neck supple.  Cardiovascular: Normal rate, regular rhythm, normal heart sounds and intact distal pulses.   Neurological: She is alert and oriented to person, place, and time.  Skin: Skin is warm and dry. No rash noted.  Psychiatric: Affect normal.  Vitals reviewed.   Recent Results (from the past 2160 hour(s))  CBC     Status: None   Collection Time: 03/16/15  9:43 AM  Result Value Ref Range   WBC 6.9 4.0 - 10.5 K/uL   RBC 4.76 3.87 - 5.11 Mil/uL   Platelets 220.0 150.0 - 400.0 K/uL     Hemoglobin 13.1 12.0 - 15.0 g/dL   HCT 41.1 36.0 - 46.0 %   MCV 86.4 78.0 - 100.0 fl   MCHC 31.8 30.0 - 36.0 g/dL   RDW 13.9 11.5 - 15.5 %  Comp Met (CMET)     Status: None   Collection Time: 03/16/15  9:43 AM  Result Value Ref Range   Sodium 142 135 - 145 mEq/L   Potassium 4.0 3.5 - 5.1 mEq/L   Chloride 108 96 - 112 mEq/L   CO2 27 19 - 32 mEq/L   Glucose, Bld 90 70 - 99 mg/dL   BUN 16 6 - 23 mg/dL   Creatinine, Ser 0.58 0.40 - 1.20 mg/dL   Total Bilirubin 0.4 0.2 - 1.2 mg/dL   Alkaline Phosphatase 77 39 - 117 U/L   AST 16 0 - 37 U/L   ALT 14 0 - 35 U/L   Total Protein 7.2 6.0 - 8.3 g/dL   Albumin 4.4 3.5 - 5.2 g/dL   Calcium 9.5 8.4 - 10.5 mg/dL   GFR 121.52 >60.00 mL/min  TSH     Status: None   Collection Time: 03/16/15  9:43 AM  Result Value Ref Range   TSH 4.24 0.35 - 4.50 uIU/mL  Lipid Profile     Status: None   Collection Time: 03/16/15  9:43 AM  Result Value Ref Range   Cholesterol 147 0 - 200 mg/dL    Comment: ATP III Classification       Desirable:  < 200 mg/dL               Borderline High:  200 - 239 mg/dL          High:  > = 240 mg/dL   Triglycerides 64.0 0.0 - 149.0 mg/dL    Comment: Normal:  <150 mg/dLBorderline High:  150 - 199 mg/dL   HDL 61.80 >39.00 mg/dL   VLDL 12.8 0.0 - 40.0 mg/dL   LDL Cholesterol 73 0 - 99 mg/dL   Total CHOL/HDL Ratio 2     Comment:                Men          Women1/2 Average Risk     3.4          3.3Average Risk          5.0  4.42X Average Risk          9.6          7.13X Average Risk          15.0          11.0                       NonHDL 85.45     Comment: NOTE:  Non-HDL goal should be 30 mg/dL higher than patient's LDL goal (i.e. LDL goal of < 70 mg/dL, would have non-HDL goal of < 100 mg/dL)  Vitamin D (25 hydroxy)     Status: Abnormal   Collection Time: 03/16/15  9:43 AM  Result Value Ref Range   VITD 21.40 (L) 30.00 - 100.00 ng/mL  B12     Status: None   Collection Time: 03/16/15  9:43 AM  Result Value Ref  Range   Vitamin B-12 456 211 - 911 pg/mL    Assessment/Plan: Acute bronchitis Rx Azithromycin.  Increase fluids.  Rest.  Saline nasal spray.  Probiotic.  Mucinex as directed.  Humidifier in bedroom. Tessalon per orders.  Call or return to clinic if symptoms are not improving.   Depression Doing well. Continue current regimen. Follow-up 3 months.

## 2015-04-15 NOTE — Patient Instructions (Signed)
Please continue your chronic medications as directed. I am glad you are feeling better! We will follow-up in 3 months.  Take antibiotic (Azithromycin) as directed if symptoms are not improving over next 24 hours.  Increase fluids.  Get plenty of rest. Use Mucinex for congestion. Use tessalon as directed for cough. Take a daily probiotic (I recommend Align or Culturelle, but even Activia Yogurt may be beneficial).  A humidifier placed in the bedroom may offer some relief for a dry, scratchy throat of nasal irritation.  Read information below on acute bronchitis. Please call or return to clinic if symptoms are not improving.  Acute Bronchitis Bronchitis is when the airways that extend from the windpipe into the lungs get red, puffy, and painful (inflamed). Bronchitis often causes thick spit (mucus) to develop. This leads to a cough. A cough is the most common symptom of bronchitis. In acute bronchitis, the condition usually begins suddenly and goes away over time (usually in 2 weeks). Smoking, allergies, and asthma can make bronchitis worse. Repeated episodes of bronchitis may cause more lung problems.  HOME CARE  Rest.  Drink enough fluids to keep your pee (urine) clear or pale yellow (unless you need to limit fluids as told by your doctor).  Only take over-the-counter or prescription medicines as told by your doctor.  Avoid smoking and secondhand smoke. These can make bronchitis worse. If you are a smoker, think about using nicotine gum or skin patches. Quitting smoking will help your lungs heal faster.  Reduce the chance of getting bronchitis again by:  Washing your hands often.  Avoiding people with cold symptoms.  Trying not to touch your hands to your mouth, nose, or eyes.  Follow up with your doctor as told.  GET HELP IF: Your symptoms do not improve after 1 week of treatment. Symptoms include:  Cough.  Fever.  Coughing up thick spit.  Body aches.  Chest  congestion.  Chills.  Shortness of breath.  Sore throat.  GET HELP RIGHT AWAY IF:   You have an increased fever.  You have chills.  You have severe shortness of breath.  You have bloody thick spit (sputum).  You throw up (vomit) often.  You lose too much body fluid (dehydration).  You have a severe headache.  You faint.  MAKE SURE YOU:   Understand these instructions.  Will watch your condition.  Will get help right away if you are not doing well or get worse. Document Released: 09/26/2007 Document Revised: 12/10/2012 Document Reviewed: 09/30/2012 [facility] Patient Information 2015 [place], [place]. This information is not intended to replace advice given to you by your health care provider. Make sure you discuss any questions you have with your health care provider.

## 2015-04-15 NOTE — Assessment & Plan Note (Signed)
Doing well. Continue current regimen. Follow-up 3 months.

## 2015-04-15 NOTE — Assessment & Plan Note (Signed)
Rx Azithromycin.  Increase fluids.  Rest.  Saline nasal spray.  Probiotic.  Mucinex as directed.  Humidifier in bedroom. Tessalon per orders.  Call or return to clinic if symptoms are not improving.  

## 2015-05-25 ENCOUNTER — Other Ambulatory Visit: Payer: Self-pay | Admitting: Physician Assistant

## 2015-06-09 ENCOUNTER — Encounter: Payer: Self-pay | Admitting: Medical

## 2015-06-09 ENCOUNTER — Ambulatory Visit (INDEPENDENT_AMBULATORY_CARE_PROVIDER_SITE_OTHER): Payer: BLUE CROSS/BLUE SHIELD | Admitting: Medical

## 2015-06-09 VITALS — BP 110/70 | HR 77 | Temp 98.1°F | Ht 65.0 in | Wt 170.0 lb

## 2015-06-09 DIAGNOSIS — Z720 Tobacco use: Secondary | ICD-10-CM

## 2015-06-09 DIAGNOSIS — Z20828 Contact with and (suspected) exposure to other viral communicable diseases: Secondary | ICD-10-CM

## 2015-06-09 DIAGNOSIS — F17200 Nicotine dependence, unspecified, uncomplicated: Secondary | ICD-10-CM

## 2015-06-09 DIAGNOSIS — J209 Acute bronchitis, unspecified: Secondary | ICD-10-CM

## 2015-06-09 MED ORDER — BENZONATATE 100 MG PO CAPS: 100.0000 mg | ORAL_CAPSULE | Freq: Three times a day (TID) | ORAL | Status: DC | PRN

## 2015-06-09 MED ORDER — AZITHROMYCIN 250 MG PO TABS: ORAL_TABLET | ORAL | Status: DC

## 2015-06-09 MED ORDER — FLUTICASONE PROPIONATE 50 MCG/ACT NA SUSP: 2.0000 | Freq: Every day | NASAL | Status: DC

## 2015-06-09 MED ORDER — OSELTAMIVIR PHOSPHATE 75 MG PO CAPS: 75.0000 mg | ORAL_CAPSULE | Freq: Every day | ORAL | Status: DC

## 2015-06-09 NOTE — Progress Notes (Signed)
Subjective:    Patient ID: Judy Walters, female    DOB: February 21, 1976, 40 y.o.   MRN: [id number]  HPI  Pt in states on Friday felt a lot of pnd. She states some chest congestion. Early on some nasal congestion and sinus pressure.   Pt states recently coughing a lot. Pt states some back when she coughs. Cough has been productive. Get bronchitis about once a year.  Pt daughter test positive both strep and flu. Pt denies diffuse body aches.   Pt is a smoker. Recently less than a pack.  LMP- 2 wks ago.      Review of Systems  Constitutional: Positive for diaphoresis and fatigue. Negative for chills.  HENT: Positive for congestion, postnasal drip and sinus pressure. Negative for ear pain, facial swelling, mouth sores and sore throat.   Respiratory: Positive for cough. Negative for chest tightness, shortness of breath and wheezing.   Cardiovascular: Negative for chest pain and palpitations.  Genitourinary: Negative for dysuria, frequency and flank pain.  Musculoskeletal: Negative for myalgias and back pain.  Skin: Negative for rash.  Neurological: Negative for dizziness and headaches.  Hematological: Negative for adenopathy. Does not bruise/bleed easily.  Psychiatric/Behavioral: Negative for behavioral problems and confusion.      Past Medical History  Diagnosis Date  . Shingles     3 at onset  . 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 History of chicken pox   . Asthma   . Depression   . Heart murmur   . Syncope   . Frequent headaches   . TIA (transient ischemic attack) 2013    Social History   Social History  . Marital Status: Single    Spouse Name: N/A  . Number of Children: 5  . Years of Education: N/A   Occupational History  . Not on file.   Social History Main Topics  . Smoking status: Current Every Day Smoker -- 1.00 packs/day for 15 years    Types: Cigarettes  . Smokeless tobacco: Never Used  . Alcohol Use: No  . Drug Use: No  . Sexual  Activity: Yes    Birth Control/ Protection: Surgical   Other Topics Concern  . Not on file   Social History Narrative    Past Surgical History  Procedure Laterality Date  . Cesarean section  2012  . Appendectomy  1989  . Tonsillectomy  1985  . Tubal ligation    . Carpel tunnel  2012  . Wisdom tooth extraction      Family History  Problem Relation Age of Onset  . Hypertension Mother     Living  . Breast cancer Mother   . Leukemia Mother   . COPD Father   . Diabetes Father   . Diabetes Sister   . Heart disease Father 39    Deceased  . Bipolar disorder Mother   . Colon cancer Maternal Grandfather   . Lung cancer Paternal Grandmother   . Pancreatic cancer Paternal Uncle   . COPD Father   . COPD Paternal Uncle   . Congestive Heart Failure Father   . Congestive Heart Failure Paternal Uncle   . Heart defect Paternal Uncle   . Stroke Paternal Uncle   . Breast cancer Maternal Aunt     x2  . Stroke Maternal Aunt   . Lung cancer Maternal Aunt   . Diabetes Sister   . Obesity Sister   . Diabetes Father   . Diabetes  Paternal Grandmother   . Diabetes Other     Paternal-side  . Heart murmur Son   . Healthy Daughter   . Asthma Son     #2  . Seizures Son     #2  . Behavior problems Son     #2    Allergies  Allergen Reactions  . Betadine [Povidone Iodine] Itching and Rash    Current Outpatient Prescriptions on File Prior to Visit  Medication Sig Dispense Refill  . ALPRAZolam (XANAX) 0.25 MG tablet TAKE ONE TABLET BY MOUTH TWICE DAILY AS NEEDED FOR ANXIETY 30 tablet 0  . escitalopram (LEXAPRO) 10 MG tablet Take 1 tablet by mouth daily.    Marland Kitchen escitalopram (LEXAPRO) 10 MG tablet TAKE ONE TABLET BY MOUTH ONCE DAILY 30 tablet 3  . Linaclotide (LINZESS) 290 MCG CAPS capsule 1 tablet once daily.    Marland Kitchen venlafaxine XR (EFFEXOR-XR) 37.5 MG 24 hr capsule Take 1 capsule at breakfast daily for 7 days, then 2 capsules daily at breakfast 60 capsule 0   No current  facility-administered medications on file prior to visit.    BP 110/70 mmHg  Pulse 77  Temp(Src) 98.1 F (36.7 C) (Oral)  Ht  (1.651 m)  Wt 170 lb (77.111 kg)  BMI 28.29 kg/m2  SpO2 98%       Objective:   Physical Exam  General  Mental Status - Alert. General Appearance - Well groomed. Not in acute distress.  Skin Rashes- No Rashes.  HEENT Head- Normal. Ear Auditory Canal - Left- Normal. Right - Normal.Tympanic Membrane- Left- Normal. Right- Normal. Eye Sclera/Conjunctiva- Left- Normal. Right- Normal. Nose & Sinuses Nasal Mucosa- Left-  Boggy and Congested. Right-  Boggy and  Congested.Bilateral maxillary and frontal sinus pressure. Mouth & Throat Lips: Upper Lip- Normal: no dryness, cracking, pallor, cyanosis, or vesicular eruption. Lower Lip-Normal: no dryness, cracking, pallor, cyanosis or vesicular eruption. Buccal Mucosa- Bilateral- No Aphthous ulcers. Oropharynx- No Discharge or Erythema. Tonsils: Characteristics- Bilateral- No Erythema or Congestion. Size/Enlargement- Bilateral- No enlargement. Discharge- bilateral-None.  Neck Neck- Supple. No Masses.   Chest and Lung Exam Auscultation: Breath Sounds:-Clear even and unlabored. Faint upper lobe rhonchi  Cardiovascular Auscultation:Rythm- Regular, rate and rhythm. Murmurs & Other Heart Sounds:Ausculatation of the heart reveal- No Murmurs.  Lymphatic Head & Neck General Head & Neck Lymphatics: Bilateral: Description- No Localized lymphadenopathy.       Assessment & Plan:  You appear to have bronchitis. Rest hydrate and tylenol for fever. I am prescribing cough medicine benzonatate, and azithromcyin antibiotic. For your nasal congestion rx flonase.  You should gradually get better. If not then notify us and would recommend a chest xray.  I will go ahead and give you preventative rx of tamiflu.  Follow up in 7-10 days or as needed

## 2015-06-09 NOTE — Progress Notes (Signed)
Pre visit review using our clinic review tool, if applicable. No additional management support is needed unless otherwise documented below in the visit note. 

## 2015-06-09 NOTE — Patient Instructions (Addendum)
You appear to have bronchitis. Rest hydrate and tylenol for fever. I am prescribing cough medicine benzonatate, and azithromcyin antibiotic. For your nasal congestion rx flonase.  You should gradually get better. If not then notify us and would recommend a chest xray.  I will go ahead and give you preventative rx of tamiflu.  Follow up in 7-10 days or as needed

## 2015-06-20 ENCOUNTER — Telehealth: Payer: Self-pay | Admitting: Medical

## 2015-06-20 NOTE — Telephone Encounter (Signed)
Pharmacy: [id number] - [place], [place] - [address]  Reason for call: Pt states that she has a continued cough. Saw Edward 06/09/15. She said it's all in her chest but not productive. Still congested but not seeing discoloration in what is coming out.

## 2015-06-20 NOTE — Telephone Encounter (Signed)
Pt needs to be seen. More than 10 days since last seen.

## 2015-06-20 NOTE — Telephone Encounter (Signed)
Pt has an appointment 06/21/15.

## 2015-06-20 NOTE — Telephone Encounter (Signed)
Edward see note below and advise if pt will need to be seen.

## 2015-06-21 ENCOUNTER — Ambulatory Visit (INDEPENDENT_AMBULATORY_CARE_PROVIDER_SITE_OTHER): Payer: BLUE CROSS/BLUE SHIELD | Admitting: Medical

## 2015-06-21 ENCOUNTER — Telehealth: Payer: Self-pay | Admitting: Medical

## 2015-06-21 ENCOUNTER — Encounter: Payer: Self-pay | Admitting: Medical

## 2015-06-21 VITALS — BP 118/78 | HR 78 | Temp 98.3°F | Resp 16 | Ht 65.0 in | Wt 170.8 lb

## 2015-06-21 DIAGNOSIS — J209 Acute bronchitis, unspecified: Secondary | ICD-10-CM

## 2015-06-21 DIAGNOSIS — R059 Cough, unspecified: Secondary | ICD-10-CM

## 2015-06-21 DIAGNOSIS — R05 Cough: Secondary | ICD-10-CM | POA: Diagnosis not present

## 2015-06-21 DIAGNOSIS — Z720 Tobacco use: Secondary | ICD-10-CM | POA: Diagnosis not present

## 2015-06-21 DIAGNOSIS — F17200 Nicotine dependence, unspecified, uncomplicated: Secondary | ICD-10-CM

## 2015-06-21 MED ORDER — CEFTRIAXONE SODIUM 1 G IJ SOLR
1.0000 g | Freq: Once | INTRAMUSCULAR | Status: AC
  Administered 2015-06-21: 1 g via INTRAMUSCULAR

## 2015-06-21 MED ORDER — ALBUTEROL SULFATE HFA 108 (90 BASE) MCG/ACT IN AERS: 2.0000 | INHALATION_SPRAY | Freq: Four times a day (QID) | RESPIRATORY_TRACT | Status: DC | PRN

## 2015-06-21 NOTE — Patient Instructions (Signed)
For persisting bronchitis symptoms with fatigue and rt side back pain, I do want to get cbc and cxr to evaluate if you have rt lower lobe pneumonia. Will give rocephin 1 gram IM today.   Continue benzonatate and Flonase. Adding albuterol inhaler due to hx of asthma and smoking history. Will update you on blood work and cxr. Then determine if further antibiotics needed.  Follow up in 7 days or as needed.

## 2015-06-21 NOTE — Addendum Note (Signed)
Addended by: Neldon Labella on: 06/21/2015 01:01 PM   Modules accepted: Orders

## 2015-06-21 NOTE — Progress Notes (Signed)
Subjective:    Patient ID: Judy Walters, female    DOB: 09-12-1975, 40 y.o.   MRN: [id number]  HPI  Pt in for follow up. She states some back pain.She points to rt lower side/over lower lobe area. Pt stills feels fatigue. Cough is persisting and worse with activity.  No diffuse body aches. Some faint pain over posterior ribs but not as much as rt side of her back.  Pt cough is dry. Pt feels like can here mucous in her chest. Pt is trying mucinex.  Pt is a smoker.   Pt has 2 kids with croup. 2 kids of her strep. Other with the flu.      Review of Systems  Constitutional: Positive for fatigue. Negative for fever and chills.  HENT: Positive for congestion and postnasal drip. Negative for rhinorrhea, sinus pressure, sneezing and sore throat.   Respiratory: Positive for cough. Negative for chest tightness, shortness of breath and wheezing.   Cardiovascular: Negative for chest pain and palpitations.  Gastrointestinal: Negative for abdominal pain.  Musculoskeletal: Negative for myalgias and back pain.       Rt lower rib area pain.   Skin: Negative for rash.  Psychiatric/Behavioral: Positive for behavioral problems. Negative for confusion.     Past Medical History  Diagnosis Date  . Shingles     3 at onset  . 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 History of chicken pox   . Asthma   . Depression   . Heart murmur   . Syncope   . Frequent headaches   . TIA (transient ischemic attack) 2013    Social History   Social History  . Marital Status: Single    Spouse Name: N/A  . Number of Children: 5  . Years of Education: N/A   Occupational History  . Not on file.   Social History Main Topics  . Smoking status: Current Every Day Smoker -- 1.00 packs/day for 15 years    Types: Cigarettes  . Smokeless tobacco: Never Used  . Alcohol Use: No  . Drug Use: No  . Sexual Activity: Yes    Birth Control/ Protection: Surgical   Other Topics Concern  . Not  on file   Social History Narrative    Past Surgical History  Procedure Laterality Date  . Cesarean section  2012  . Appendectomy  1989  . Tonsillectomy  1985  . Tubal ligation    . Carpel tunnel  2012  . Wisdom tooth extraction      Family History  Problem Relation Age of Onset  . Hypertension Mother     Living  . Breast cancer Mother   . Leukemia Mother   . COPD Father   . Diabetes Father   . Diabetes Sister   . Heart disease Father 29    Deceased  . Bipolar disorder Mother   . Colon cancer Maternal Grandfather   . Lung cancer Paternal Grandmother   . Pancreatic cancer Paternal Uncle   . COPD Father   . COPD Paternal Uncle   . Congestive Heart Failure Father   . Congestive Heart Failure Paternal Uncle   . Heart defect Paternal Uncle   . Stroke Paternal Uncle   . Breast cancer Maternal Aunt     x2  . Stroke Maternal Aunt   . Lung cancer Maternal Aunt   . Diabetes Sister   . Obesity Sister   . Diabetes Father   .  Diabetes Paternal Grandmother   . Diabetes Other     Paternal-side  . Heart murmur Son   . Healthy Daughter   . Asthma Son     #2  . Seizures Son     #2  . Behavior problems Son     #2    Allergies  Allergen Reactions  . Betadine [Povidone Iodine] Itching and Rash    Current Outpatient Prescriptions on File Prior to Visit  Medication Sig Dispense Refill  . ALPRAZolam (XANAX) 0.25 MG tablet TAKE ONE TABLET BY MOUTH TWICE DAILY AS NEEDED FOR ANXIETY 30 tablet 0  . escitalopram (LEXAPRO) 10 MG tablet Take 1 tablet by mouth daily.    Marland Kitchen escitalopram (LEXAPRO) 10 MG tablet TAKE ONE TABLET BY MOUTH ONCE DAILY 30 tablet 3  . fluticasone (FLONASE) 50 MCG/ACT nasal spray Place 2 sprays into both nostrils daily. 16 g 1  . Linaclotide (LINZESS) 290 MCG CAPS capsule 1 tablet once daily.    Marland Kitchen oseltamivir (TAMIFLU) 75 MG capsule Take 1 capsule (75 mg total) by mouth daily. 10 capsule 0  . venlafaxine XR (EFFEXOR-XR) 37.5 MG 24 hr capsule Take 1 capsule at  breakfast daily for 7 days, then 2 capsules daily at breakfast 60 capsule 0   No current facility-administered medications on file prior to visit.    BP 118/78 mmHg  Pulse 78  Temp(Src) 98.3 F (36.8 C) (Oral)  Resp 16  Ht  (1.651 m)  Wt 170 lb 12.8 oz (77.474 kg)  BMI 28.42 kg/m2  SpO2 98%       Objective:   Physical Exam   General  Mental Status - Alert. General Appearance - Well groomed. Not in acute distress.  Skin Rashes- No Rashes.  HEENT Head- Normal. Ear Auditory Canal - Left- Normal. Right - Normal.Tympanic Membrane- Left- Normal. Right- Normal. Eye Sclera/Conjunctiva- Left- Normal. Right- Normal. Nose & Sinuses Nasal Mucosa- Left-  Mild boggy and  Congested. Right-  Mild  boggy and  Congested. Mouth & Throat Lips: Upper Lip- Normal: no dryness, cracking, pallor, cyanosis, or vesicular eruption. Lower Lip-Normal: no dryness, cracking, pallor, cyanosis or vesicular eruption. Buccal Mucosa- Bilateral- No Aphthous ulcers. Oropharynx- No Discharge or Erythema. Tonsils: Characteristics- Bilateral- No Erythema or Congestion. Size/Enlargement- Bilateral- No enlargement. Discharge- bilateral-None.  Neck Neck- Supple. No Masses.   Chest and Lung Exam Auscultation: Breath Sounds:- even , unlabored, faint rhonchi.  Cardiovascular Auscultation:Rythm- Regular, rate and rhythm. Murmurs & Other Heart Sounds:Ausculatation of the heart reveal- No Murmurs.  Lymphatic Head & Neck General Head & Neck Lymphatics: Bilateral: Description- No Localized lymphadenopathy.      Assessment & Plan:  For persisting bronchitis symptoms with fatigue and rt side back pain, I do want to get cbc and cxr to evaluate if you have rt lower lobe pneumonia. Will give rocephin 1 gram IM today.   Continue benzonatate and Flonase. Adding albuterol inhaler due to hx of asthma and smoking history. Will update you on blood work and cxr. Then determine if further antibiotics  needed.  Follow up in 7 days or as needed.

## 2015-06-21 NOTE — Telephone Encounter (Signed)
Sent in rx albuterol

## 2015-06-21 NOTE — Progress Notes (Signed)
Pre visit review using our clinic review tool, if applicable. No additional management support is needed unless otherwise documented below in the visit note. 

## 2015-07-15 ENCOUNTER — Ambulatory Visit: Payer: BLUE CROSS/BLUE SHIELD | Admitting: Physician Assistant

## 2015-07-15 ENCOUNTER — Telehealth: Payer: Self-pay | Admitting: Physician Assistant

## 2015-07-19 ENCOUNTER — Encounter: Payer: Self-pay | Admitting: Physician Assistant

## 2015-07-19 NOTE — Telephone Encounter (Signed)
Pt was no show 07/15/15 9:15 am for follow up appt, this is 1st no show, charge or no charge?

## 2015-07-19 NOTE — Telephone Encounter (Signed)
No charge for 1st no-show 

## 2015-07-19 NOTE — Telephone Encounter (Signed)
Waiving fee, mailing reminder letter °

## 2015-07-20 ENCOUNTER — Ambulatory Visit: Payer: Self-pay | Admitting: Neurology

## 2015-08-16 ENCOUNTER — Telehealth: Payer: Self-pay | Admitting: Family Medicine

## 2015-08-16 ENCOUNTER — Ambulatory Visit (INDEPENDENT_AMBULATORY_CARE_PROVIDER_SITE_OTHER): Payer: BLUE CROSS/BLUE SHIELD | Admitting: Family Medicine

## 2015-08-16 ENCOUNTER — Ambulatory Visit (HOSPITAL_BASED_OUTPATIENT_CLINIC_OR_DEPARTMENT_OTHER)
Admission: RE | Admit: 2015-08-16 | Discharge: 2015-08-16 | Disposition: A | Discharge status: Home / Self Care | Payer: BLUE CROSS/BLUE SHIELD | Source: Ambulatory Visit | Attending: Family Medicine | Admitting: Family Medicine

## 2015-08-16 ENCOUNTER — Encounter: Payer: Self-pay | Admitting: Family Medicine

## 2015-08-16 VITALS — BP 112/76 | HR 81 | Temp 97.9°F | Resp 20 | Wt 164.5 lb

## 2015-08-16 DIAGNOSIS — R109 Unspecified abdominal pain: Secondary | ICD-10-CM

## 2015-08-16 LAB — CBC WITH DIFFERENTIAL/PLATELET
BASOS ABS: 0.1 10*3/uL (ref 0.0–0.1)
BASOS PCT: 0.4 % (ref 0.0–3.0)
EOS ABS: 0.2 10*3/uL (ref 0.0–0.7)
Eosinophils Relative: 1.4 % (ref 0.0–5.0)
HEMATOCRIT: 40.2 % (ref 36.0–46.0)
Hemoglobin: 13.4 g/dL (ref 12.0–15.0)
LYMPHS PCT: 13.8 % (ref 12.0–46.0)
Lymphs Abs: 1.9 10*3/uL (ref 0.7–4.0)
MCHC: 33.4 g/dL (ref 30.0–36.0)
MCV: 84.1 fl (ref 78.0–100.0)
MONO ABS: 1 10*3/uL (ref 0.1–1.0)
Monocytes Relative: 7.3 % (ref 3.0–12.0)
NEUTROS ABS: 10.5 10*3/uL — AB (ref 1.4–7.7)
Neutrophils Relative %: 77.1 % — ABNORMAL HIGH (ref 43.0–77.0)
PLATELETS: 257 10*3/uL (ref 150.0–400.0)
RBC: 4.78 Mil/uL (ref 3.87–5.11)
RDW: 14.3 % (ref 11.5–15.5)
WBC: 13.7 10*3/uL — ABNORMAL HIGH (ref 4.0–10.5)

## 2015-08-16 LAB — LIPASE: LIPASE: 22 U/L (ref 11.0–59.0)

## 2015-08-16 LAB — COMPREHENSIVE METABOLIC PANEL
ALBUMIN: 4.2 g/dL (ref 3.5–5.2)
ALT: 10 U/L (ref 0–35)
AST: 13 U/L (ref 0–37)
Alkaline Phosphatase: 95 U/L (ref 39–117)
BUN: 10 mg/dL (ref 6–23)
CHLORIDE: 105 meq/L (ref 96–112)
CO2: 28 meq/L (ref 19–32)
CREATININE: 0.67 mg/dL (ref 0.40–1.20)
Calcium: 9.3 mg/dL (ref 8.4–10.5)
GFR: 102.67 mL/min (ref >60.00)
GLUCOSE: 95 mg/dL (ref 70–99)
POTASSIUM: 4.3 meq/L (ref 3.5–5.1)
SODIUM: 139 meq/L (ref 135–145)
Total Bilirubin: 0.3 mg/dL (ref 0.2–1.2)
Total Protein: 6.8 g/dL (ref 6.0–8.3)

## 2015-08-16 LAB — C-REACTIVE PROTEIN: CRP: 4.1 mg/dL (ref 0.5–20.0)

## 2015-08-16 MED ORDER — CIPROFLOXACIN HCL 500 MG PO TABS: 500.0000 mg | ORAL_TABLET | Freq: Two times a day (BID) | ORAL | Status: DC

## 2015-08-16 MED ORDER — METRONIDAZOLE 500 MG PO TABS: 500.0000 mg | ORAL_TABLET | Freq: Three times a day (TID) | ORAL | Status: DC

## 2015-08-16 NOTE — Telephone Encounter (Signed)
Spoke with patient reviewed US results 

## 2015-08-16 NOTE — Progress Notes (Signed)
Patient ID: Judy ChartersStephanie Walters, Walters   DOB: Jan 07, 1976, 40 y.o.   MRN: [id number]    Judy Walters MRN: [id number]  CC: abdomen pain Subjective: Pt presents for an acute OV with complaints of mid-left abd pain since Saturday (4 days).  Pain is 6/10 when it is sharp and shooting. Associated symptoms include bloating. Pt feels symptoms are worse with bending and  She states if she pushes on the area it also sends a pain to the other side of her abdomen. She describes the pain as intermittent shooting pain to her groin, with constant dull pain left of umbilicus. She has a history of IBS, slow transit constipation and is prescribed linzess, which she has been taking. She has also taken gasx, none of which have helped ease her discomfort. She has 2-3 BM that were soft runny from Linzess, no hematochezia. She states she never had a pain like this before and it is not consistent with her prior IBS pain. She denies fever, chills, nausea or vomit. She endorses decreased appetite and full feeling. She did not feel relief with BM.  She does endorse a change in diet, eating more salads and drinking water.  She denies increased in activity or lifting.  She has had abd surgery, laparoscopy and C-sections.  She has been under more stress lately. She has lost 6 lb in a few months.  She is a smoker and has a FH of blood clots and multiple cancers.    Allergies  Allergen Reactions  . Betadine [Povidone Iodine] Itching and Rash   Social History  Substance Use Topics  . Smoking status: Current Every Day Smoker -- 1.00 packs/day for 15 years    Types: Cigarettes  . Smokeless tobacco: Never Used  . Alcohol Use: No   Past Medical History  Diagnosis Date  . Shingles     3 at onset  . 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 History of chicken pox   . Asthma   . Depression   . Heart murmur   . Syncope   . Frequent headaches   . TIA (transient ischemic attack)  2013   Past Surgical History  Procedure Laterality Date  . Cesarean section  2012  . Appendectomy  1989  . Tonsillectomy  1985  . Tubal ligation    . Carpel tunnel  2012  . Wisdom tooth extraction     Family History  Problem Relation Age of Onset  . Hypertension Mother     Living  . Breast cancer Mother   . Leukemia Mother   . COPD Father   . Diabetes Father   . Diabetes Sister   . Heart disease Father [id number]    Deceased  . Bipolar disorder Mother   . Colon cancer Maternal Grandfather   . Lung cancer Paternal Grandmother   . Pancreatic cancer Paternal Uncle   . COPD Father   . COPD Paternal Uncle   . Congestive Heart Failure Father   . Congestive Heart Failure Paternal Uncle   . Heart defect Paternal Uncle   . Stroke Paternal Uncle   . Breast cancer Maternal Aunt     x2  . Stroke Maternal Aunt   . Lung cancer Maternal Aunt   . Diabetes Sister   . Obesity Sister   . Diabetes Father   . Diabetes Paternal Grandmother   . Diabetes Other     Paternal-side  . Heart murmur  Son   . Healthy Daughter   . Asthma Son     #2  . Seizures Son     #2  . Behavior problems Son     #2     Medication List       This list is accurate as of: 08/16/15  1:57 PM.  Always use your most recent med list.               albuterol 108 (90 Base) MCG/ACT inhaler  Commonly known as:  PROVENTIL HFA;VENTOLIN HFA  Inhale 2 puffs into the lungs every 6 (six) hours as needed for wheezing or shortness of breath.     ALPRAZolam 0.25 MG tablet  Commonly known as:  XANAX  TAKE ONE TABLET BY MOUTH TWICE DAILY AS NEEDED FOR ANXIETY     escitalopram 10 MG tablet  Commonly known as:  LEXAPRO  TAKE ONE TABLET BY MOUTH ONCE DAILY     fluticasone 50 MCG/ACT nasal spray  Commonly known as:  FLONASE  Place 2 sprays into both nostrils daily.     LINZESS 290 MCG Caps capsule  Generic drug:  linaclotide  1 tablet once daily.     venlafaxine XR 37.5 MG 24 hr capsule  Commonly known as:   EFFEXOR-XR  Take 1 capsule at breakfast daily for 7 days, then 2 capsules daily at breakfast       ROS: Negative, with the exception of above mentioned in HPI  Objective:  BP 112/76 mmHg  Pulse 81  Temp(Src) 97.9 F (36.6 C) (Oral)  Resp 20  Wt 164 lb 8 oz (74.617 kg)  SpO2 98%  LMP 08/01/2015 Body mass index is 27.37 kg/(m^2). Gen: Afebrile. No acute distress. Nontoxic in appearance. Well developed, well nourished, Walters.  HENT: AT. [place].  MMM, no oral lesions.  Eyes:Pupils Equal Round Reactive to light, Extraocular movements intact,  Conjunctiva without redness, discharge or icterus. Neck/lymp/endocrine: Supple, no lymphadenopathy CV: RRR, no murmur Chest: CTAB, no wheeze or crackles. Good air movement, normal resp effort.  Abd: Soft. Flat, striae.. ND. TTP with reproduction of shooting pain 1 in superior and lateral to umbilical over pulsatile area. BS present. No  Masses palpated.  rebound present, no  guarding. No HSM.  Skin: no rashes, purpura or petechiae. No skin changes of abdomen.  Neuro: Normal gait. PERLA. EOMi. Alert. Oriented x3  Assessment/Plan: Judy Walters is a 40 y.o. Walters present for acute OV for   Abdominal pain, unspecified abdominal location - discussed with pt if she needs we can call in medication for pain control, however many of these medications would like make her constipation issues more severe. She decided to wait, if needed will call in medication.  - discussed this could be as simple as her IBS flaring with her stress level, however her pain on exam was quite localized and over aorta area.  - CBC w/Diff - US Abdomen Complete; Future - Lipase - Comprehensive metabolic panel - C-reactive protein - F/u dependent on results, pt encouraged to go to ED if symptoms are worsening.    electronically signed by:  Felix Pacini, DO  [facility] [place]

## 2015-08-16 NOTE — Patient Instructions (Signed)
I ordered an US for you to get completed as soon as possible. We will complete some labs today as well.   If the pain worsens please be seen in ED. If you need medication for pain we can try a low dose narcotic, however would like to try to avoid if possible with your constipation history.   Irritable Bowel Syndrome, Adult Irritable bowel syndrome (IBS) is not one specific disease. It is a group of symptoms that affects the organs responsible for digestion (gastrointestinal or GI tract).  To regulate how your GI tract works, your body sends signals back and forth between your intestines and your brain. If you have IBS, there may be a problem with these signals. As a result, your GI tract does not function normally. Your intestines may become more sensitive and overreact to certain things. This is especially true when you eat certain foods or when you are under stress.  There are four types of IBS. These may be determined based on the consistency of your stool:   IBS with diarrhea.   IBS with constipation.   Mixed IBS.   Unsubtyped IBS.  It is important to know which type of IBS you have. Some treatments are more likely to be helpful for certain types of IBS.  CAUSES  The exact cause of IBS is not known. RISK FACTORS You may have a higher risk of IBS if:  You are a woman.  You are younger than 42 years old.  You have a family history of IBS.  You have mental health problems.  You have had bacterial infection of your GI tract. SIGNS AND SYMPTOMS  Symptoms of IBS vary from person to person. The main symptom is abdominal pain or discomfort. Additional symptoms usually include one or more of the following:   Diarrhea, constipation, or both.   Abdominal swelling or bloating.   Feeling full or sick after eating a small or regular-size meal.   Frequent gas.   Mucus in the stool.   A feeling of having more stool left after a bowel movement.  Symptoms tend to come and  go. They may be associated with stress, psychiatric conditions, or nothing at all.  DIAGNOSIS  There is no specific test to diagnose IBS. Your health care provider will make a diagnosis based on a physical exam, medical history, and your symptoms. You may have other tests to rule out other conditions that may be causing your symptoms. These may include:   Blood tests.   X-rays.   CT scan.  Endoscopy and colonoscopy. This is a test in which your GI tract is viewed with a long, thin, flexible tube. TREATMENT There is no cure for IBS, but treatment can help relieve symptoms. IBS treatment often includes:   Changes to your diet, such as:  Eating more fiber.  Avoiding foods that cause symptoms.  Drinking more water.  Eating regular, medium-sized portioned meals.  Medicines. These may include:  Fiber supplements if you have constipation.  Medicine to control diarrhea (antidiarrheal medicines).  Medicine to help control muscle spasms in your GI tract (antispasmodic medicines).  Medicines to help with any mental health issues, such as antidepressants or tranquilizers.  Therapy.  Talk therapy may help with anxiety, depression, or other mental health issues that can make IBS symptoms worse.  Stress reduction.  Managing your stress can help keep symptoms under control. HOME CARE INSTRUCTIONS   Take medicines only as directed by your health care provider.  Eat a  healthy diet.  Avoid foods and drinks with added sugar.  Include more whole grains, fruits, and vegetables gradually into your diet. This may be especially helpful if you have IBS with constipation.  Avoid any foods and drinks that make your symptoms worse. These may include dairy products and caffeinated or carbonated drinks.  Do not eat large meals.  Drink enough fluid to keep your urine clear or pale yellow.  Exercise regularly. Ask your health care provider for recommendations of good activities for  you.  Keep all follow-up visits as directed by your health care provider. This is important. SEEK MEDICAL CARE IF:   You have constant pain.  You have trouble or pain with swallowing.  You have worsening diarrhea. SEEK IMMEDIATE MEDICAL CARE IF:   You have severe and worsening abdominal pain.   You have diarrhea and:   You have a rash, stiff neck, or severe headache.   You are irritable, sleepy, or difficult to awaken.   You are weak, dizzy, or extremely thirsty.   You have bright red blood in your stool or you have black tarry stools.   You have unusual abdominal swelling that is painful.   You vomit continuously.   You vomit blood (hematemesis).   You have both abdominal pain and a fever.    This information is not intended to replace advice given to you by your health care provider. Make sure you discuss any questions you have with your health care provider.   Document Released: 04/09/2005 Document Revised: 04/30/2014 Document Reviewed: 12/25/2013 Elsevier Interactive Patient Education [employer]

## 2015-08-16 NOTE — Telephone Encounter (Signed)
Please call pt: - her US is normal.  - we will call her with labs once resulted.

## 2015-08-16 NOTE — Telephone Encounter (Signed)
Please call pt: - her blood work results indicate she has a mild infection. Consider where she is tender, she may have a mild intestinal infection. I have called in a treatment of 2 abx for 7 days. She will need to follow up with us if she does not have complete resolution of her symptoms in 1 week, so we may investigate further. If she is worsening she should be seen immediately, either in the office or ED.

## 2015-08-17 NOTE — Telephone Encounter (Signed)
Spoke with patient reviewed lab results and instructions with patient . Patient understands to take antibiotics as directed . She will follow up with her PCP if no improvement. She understands if symptoms worse to report to ER.

## 2015-09-02 ENCOUNTER — Telehealth: Payer: Self-pay | Admitting: Physician Assistant

## 2015-09-02 MED ORDER — FLUCONAZOLE 150 MG PO TABS: 150.0000 mg | ORAL_TABLET | Freq: Once | ORAL | Status: DC

## 2015-09-02 NOTE — Telephone Encounter (Signed)
Caller name: Self  Can be reached: [phone]  Pharmacy:  [id number] - [place], [place] - [address] [phone])         Reason for call: Patient was seen at [facility] on 4/25 by Dr. Claiborne BillingsKuneff for an infection in her intestines, was given 2 antibiotics and now has a yeast infection. Request rx for yeast infection.

## 2015-09-02 NOTE — Telephone Encounter (Signed)
I have sent a diflucan to her pharmacy to take as directed.

## 2016-04-17 LAB — HM PAP SMEAR: HM PAP: NORMAL

## 2016-06-08 ENCOUNTER — Ambulatory Visit (INDEPENDENT_AMBULATORY_CARE_PROVIDER_SITE_OTHER): Payer: BLUE CROSS/BLUE SHIELD | Admitting: Physician Assistant

## 2016-06-08 ENCOUNTER — Encounter: Payer: Self-pay | Admitting: Physician Assistant

## 2016-06-08 VITALS — BP 110/72 | HR 76 | Temp 98.2°F | Resp 14 | Ht 65.0 in | Wt 172.0 lb

## 2016-06-08 DIAGNOSIS — Z720 Tobacco use: Secondary | ICD-10-CM | POA: Diagnosis not present

## 2016-06-08 DIAGNOSIS — F411 Generalized anxiety disorder: Secondary | ICD-10-CM

## 2016-06-08 MED ORDER — SERTRALINE HCL 25 MG PO TABS: 25.0000 mg | ORAL_TABLET | Freq: Every day | ORAL | 1 refills | Status: DC

## 2016-06-08 MED ORDER — NICOTINE 21 MG/24HR TD PT24: 21.0000 mg | MEDICATED_PATCH | Freq: Every day | TRANSDERMAL | 0 refills | Status: DC

## 2016-06-08 NOTE — Progress Notes (Signed)
Pre visit review using our clinic review tool, if applicable. No additional management support is needed unless otherwise documented below in the visit note. 

## 2016-06-08 NOTE — Progress Notes (Signed)
Patient presents to clinic today to discuss significant anxiety.  Patient endorses feeling like she is juggling multiple things at work and also at home. Notes her oldest daughter was recently in a car wreck, doing fine, but has caused financial stressors. Also notes her twins -- recently found out one is deaf and the other has Autism.  Feels overwhelmed and anxious most all day, most days of the week. Denies depressed mood or anhedonia. 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 Has 4 children with ADHD. States she noted issue with her focus since childhood but has never had any formal diagnosis of ADHD. Would like testing.    Past Medical History:  Diagnosis Date  . Asthma   . Depression   . Frequent headaches   . Heart murmur   . History of chicken pox   . 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 Shingles    3 at onset  . Syncope   . TIA (transient ischemic attack) 2013    Current Outpatient Prescriptions on File Prior to Visit  Medication Sig Dispense Refill  . Linaclotide (LINZESS) 290 MCG CAPS capsule 1 tablet once daily.    Marland Kitchen. ALPRAZolam (XANAX) 0.25 MG tablet TAKE ONE TABLET BY MOUTH TWICE DAILY AS NEEDED FOR ANXIETY (Patient not taking: Reported on 06/08/2016) 30 tablet 0   No current facility-administered medications on file prior to visit.     Allergies  Allergen Reactions  . Betadine [Povidone Iodine] Itching and Rash    Family History  Problem Relation Age of Onset  . Hypertension Mother     Living  . Breast cancer Mother   . Leukemia Mother   . COPD Father   . Diabetes Father   . Diabetes Sister   . Heart disease Father [id number]    Deceased  . Bipolar disorder Mother   . Colon cancer Maternal Grandfather   . Lung cancer Paternal Grandmother   . Pancreatic cancer Paternal Uncle   . COPD Father   . COPD Paternal Uncle   . Congestive Heart Failure Father   . Congestive Heart Failure Paternal Uncle   . Heart  defect Paternal Uncle   . Stroke Paternal Uncle   . Breast cancer Maternal Aunt     x2  . Stroke Maternal Aunt   . Lung cancer Maternal Aunt   . Diabetes Sister   . Obesity Sister   . Diabetes Father   . Diabetes Paternal Grandmother   . Diabetes Other     Paternal-side  . Heart murmur Son   . Healthy Daughter   . Asthma Son     #2  . Seizures Son     #2  . Behavior problems Son     #2    Social History   Social History  . Marital status: Single    Spouse name: N/A  . Number of children: 5  . Years of education: N/A   Social History Main Topics  . Smoking status: Current Every Day Smoker    Packs/day: 1.00    Years: 15.00    Types: Cigarettes  . Smokeless tobacco: Never Used  . Alcohol use No  . Drug use: No  . Sexual activity: Yes    Birth control/ protection: Surgical   Other Topics Concern  . None   Social History Narrative  . None   Review of Systems - See HPI.  All other ROS are  negative.  BP 110/72   Pulse 76   Temp 98.2 F (36.8 C) (Oral)   Resp 14   Ht 5\' 5"  (1.651 m)   Wt 172 lb (78 kg)   SpO2 98%   BMI 28.62 kg/m   Physical Exam  Constitutional: She is oriented to person, place, and time and well-developed, well-nourished, and in no distress.  HENT:  Head: Normocephalic and atraumatic.  Eyes: Conjunctivae are normal.  Neck: Neck supple.  Cardiovascular: Normal rate, regular rhythm, normal heart sounds and intact distal pulses.   Pulmonary/Chest: Effort normal and breath sounds normal. No respiratory distress. She has no wheezes. She has no rales. She exhibits no tenderness.  Neurological: She is alert and oriented to person, place, and time.  Skin: Skin is warm and dry. No rash noted.  Psychiatric: Her mood appears anxious. She does not exhibit a depressed mood. She expresses no homicidal and no suicidal ideation. She expresses no suicidal plans and no homicidal plans.  Vitals reviewed.  Assessment/Plan: Anxiety state Generalized  anxiety exacerbated by situational triggers. Has been on Sertraline previously for post-partum depression with good success. Will start at 25 mg daily for anxiety. Counseling recommended. Handout given. FU scheduled.  Tobacco use Discussed options.  Will start Nicoderm CQ. FU scheduled.    Piedad Climes, PA-C

## 2016-06-08 NOTE — Patient Instructions (Signed)
Please start the Sertraline as directed. If tolerating well for a few days, then start your Nicoderm patches. Call the number given to schedule an appointment for ADD evaluation with one of our specialists.  Follow-up with me in 4 weeks. We will do your physical at that time.  If you note any worsening mood or anxiety on medication, stop and call or come see me ASAP. If you ever have any thoughts of harming yourself or others, please call 911 or go to the nearest ER.

## 2016-06-09 DIAGNOSIS — Z720 Tobacco use: Secondary | ICD-10-CM | POA: Insufficient documentation

## 2016-06-09 DIAGNOSIS — F411 Generalized anxiety disorder: Secondary | ICD-10-CM | POA: Insufficient documentation

## 2016-06-09 NOTE — Assessment & Plan Note (Signed)
Discussed options.  Will start Nicoderm CQ. FU scheduled.

## 2016-06-09 NOTE — Assessment & Plan Note (Signed)
Generalized anxiety exacerbated by situational triggers. Has been on Sertraline previously for post-partum depression with good success. Will start at 25 mg daily for anxiety. Counseling recommended. Handout given. FU scheduled.

## 2016-06-12 ENCOUNTER — Telehealth: Payer: Self-pay | Admitting: *Deleted

## 2016-06-12 NOTE — Telephone Encounter (Signed)
PA started through cover my meds  Key: [id number]

## 2016-06-14 NOTE — Telephone Encounter (Signed)
Insurance states that this medication does not require prior authorization - but it is only covered under certain limitations.   They could not tell me what those were, only that patient would have to call to see what her plan allows.   I have left a message for the patient to explain this, and left the office number here that she could call me with any additional questions - I also asked her to let me know what she finds out so that we can address it with Selena BattenCody and get everything called in correctly.

## 2016-08-16 ENCOUNTER — Encounter: Payer: Self-pay | Admitting: Physician Assistant

## 2016-08-16 ENCOUNTER — Ambulatory Visit (INDEPENDENT_AMBULATORY_CARE_PROVIDER_SITE_OTHER): Payer: BLUE CROSS/BLUE SHIELD | Admitting: Physician Assistant

## 2016-08-16 VITALS — BP 108/64 | HR 68 | Temp 98.0°F | Resp 14 | Ht 65.5 in | Wt 173.0 lb

## 2016-08-16 DIAGNOSIS — F32A Depression, unspecified: Secondary | ICD-10-CM

## 2016-08-16 DIAGNOSIS — Z0000 Encounter for general adult medical examination without abnormal findings: Secondary | ICD-10-CM

## 2016-08-16 DIAGNOSIS — Z1239 Encounter for other screening for malignant neoplasm of breast: Secondary | ICD-10-CM

## 2016-08-16 DIAGNOSIS — F329 Major depressive disorder, single episode, unspecified: Secondary | ICD-10-CM

## 2016-08-16 DIAGNOSIS — Z1231 Encounter for screening mammogram for malignant neoplasm of breast: Secondary | ICD-10-CM | POA: Diagnosis not present

## 2016-08-16 LAB — CBC
HEMATOCRIT: 42.6 % (ref 36.0–46.0)
HEMOGLOBIN: 13.9 g/dL (ref 12.0–15.0)
MCHC: 32.5 g/dL (ref 30.0–36.0)
MCV: 86.9 fl (ref 78.0–100.0)
Platelets: 221 10*3/uL (ref 150.0–400.0)
RBC: 4.9 Mil/uL (ref 3.87–5.11)
RDW: 14.4 % (ref 11.5–15.5)
WBC: 8.7 10*3/uL (ref 4.0–10.5)

## 2016-08-16 LAB — LIPID PANEL
CHOL/HDL RATIO: 3
Cholesterol: 160 mg/dL (ref 0–200)
HDL: 61.9 mg/dL (ref >39.00)
LDL CALC: 86 mg/dL (ref 0–99)
NONHDL: 98.33
TRIGLYCERIDES: 61 mg/dL (ref 0.0–149.0)
VLDL: 12.2 mg/dL (ref 0.0–40.0)

## 2016-08-16 LAB — COMPREHENSIVE METABOLIC PANEL
ALT: 11 U/L (ref 0–35)
AST: 16 U/L (ref 0–37)
Albumin: 4.4 g/dL (ref 3.5–5.2)
Alkaline Phosphatase: 77 U/L (ref 39–117)
BUN: 12 mg/dL (ref 6–23)
CALCIUM: 9.6 mg/dL (ref 8.4–10.5)
CHLORIDE: 109 meq/L (ref 96–112)
CO2: 29 meq/L (ref 19–32)
Creatinine, Ser: 0.65 mg/dL (ref 0.40–1.20)
GFR: 105.82 mL/min (ref >60.00)
GLUCOSE: 87 mg/dL (ref 70–99)
POTASSIUM: 5 meq/L (ref 3.5–5.1)
Sodium: 139 mEq/L (ref 135–145)
Total Bilirubin: 0.3 mg/dL (ref 0.2–1.2)
Total Protein: 6.9 g/dL (ref 6.0–8.3)

## 2016-08-16 LAB — HEMOGLOBIN A1C: Hgb A1c MFr Bld: 5.7 % (ref 4.6–6.5)

## 2016-08-16 LAB — TSH: TSH: 3.46 u[IU]/mL (ref 0.35–4.50)

## 2016-08-16 MED ORDER — SERTRALINE HCL 50 MG PO TABS: 50.0000 mg | ORAL_TABLET | Freq: Every day | ORAL | 3 refills | Status: DC

## 2016-08-16 MED ORDER — ALPRAZOLAM 0.25 MG PO TABS: 0.2500 mg | ORAL_TABLET | Freq: Two times a day (BID) | ORAL | 0 refills | Status: DC | PRN

## 2016-08-16 NOTE — Progress Notes (Signed)
Patient presents to clinic today for annual exam.  Patient is fasting for labs. Body mass index is 28.35 kg/m. Diet -- Endorses diet is well-balanced overall. Has stopped soda intake. Exercise -- Is currently walking daily depending on the weather.   Chronic Issues: Depression and Anxiety -- Currently on sertraline 25 mg daily. Is taking as directed. Notes improvement in symptoms with this regimen but still having breakthrough anxiety. Some increased stressors recently -- son dealing with autistic son as a single mother. Daughter is in depression with suicidal thoughts (is being treated by a provider). Has had some panic attacks due to this. Has previously taken Alprazolam for panic attacks but has not taken in quite some time.    Chronic Idiopathic Constipation -- Currently on Linzess 290 mcg once daily. Previously prescribed by her gynecologist. States she was not well-controlled with lower dosing. Endorses good bowel movements with this regimen. Tolerating without side effect.  Health Maintenance: Immunizations -- up-to-date Mammogram -- Last February 2017. Order placed. Patient requests to have done at [facility].  PAP -- up-to-date.   Patient declines HIV screen.  Past Medical History:  Diagnosis Date  . Asthma   . Depression   . Frequent headaches   . Heart murmur   . History of chicken pox   . 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 Shingles    3 at onset  . Syncope   . TIA (transient ischemic attack) 2013    Past Surgical History:  Procedure Laterality Date  . APPENDECTOMY  1989  . carpel tunnel  2012  . CESAREAN SECTION  2012  . TONSILLECTOMY  1985  . TUBAL LIGATION    . WISDOM TOOTH EXTRACTION      Current Outpatient Prescriptions on File Prior to Visit  Medication Sig Dispense Refill  . Linaclotide (LINZESS) 290 MCG CAPS capsule 1 tablet once daily.     No current facility-administered medications on file prior to visit.     Allergies    Allergen Reactions  . Betadine [Povidone Iodine] Itching and Rash    Family History  Problem Relation Age of Onset  . Hypertension Mother     Living  . Breast cancer Mother   . Leukemia Mother   . Bipolar disorder Mother   . COPD Father   . Diabetes Father   . Heart disease Father 46    Deceased  . Congestive Heart Failure Father   . Diabetes Sister   . Colon cancer Maternal Grandfather   . Lung cancer Paternal Grandmother   . Diabetes Paternal Grandmother   . Pancreatic cancer Paternal Uncle   . COPD Paternal Uncle   . Congestive Heart Failure Paternal Uncle   . Heart defect Paternal Uncle   . Stroke Paternal Uncle   . Breast cancer Maternal Aunt     x2  . Stroke Maternal Aunt   . Lung cancer Maternal Aunt   . Diabetes Sister   . Obesity Sister   . Diabetes Other     Paternal-side  . Heart murmur Son   . Healthy Daughter   . Asthma Son     #2  . Seizures Son     #2  . Behavior problems Son     #2    Social History   Social History  . Marital status: Single    Spouse name: N/A  . Number of children: 5  . Years of education: N/A   Occupational  History  . Not on file.   Social History Main Topics  . Smoking status: Current Every Day Smoker    Packs/day: 1.00    Years: 15.00    Types: Cigarettes  . Smokeless tobacco: Never Used  . Alcohol use No  . Drug use: No  . Sexual activity: Yes    Birth control/ protection: Surgical   Other Topics Concern  . Not on file   Social History Narrative  . No narrative on file   Review of Systems  Constitutional: Negative for fever and weight loss.  HENT: Negative for ear discharge, ear pain, hearing loss and tinnitus.   Eyes: Negative for blurred vision, double vision, photophobia and pain.  Respiratory: Negative for cough and shortness of breath.   Cardiovascular: Negative for chest pain and palpitations.  Gastrointestinal: Positive for constipation. Negative for abdominal pain, blood in stool, diarrhea,  heartburn, melena, nausea and vomiting.  Genitourinary: Negative for dysuria, flank pain, frequency, hematuria and urgency.  Musculoskeletal: Negative for falls.  Neurological: Negative for dizziness, loss of consciousness and headaches.  Endo/Heme/Allergies: Negative for environmental allergies.  Psychiatric/Behavioral: Negative for depression, hallucinations, substance abuse and suicidal ideas. The patient is not nervous/anxious and does not have insomnia.    BP 108/64   Pulse 68   Temp 98 F (36.7 C) (Oral)   Resp 14   Ht 5' 5.5" (1.664 m)   Wt 173 lb (78.5 kg)   SpO2 99%   BMI 28.35 kg/m   Physical Exam  Constitutional: She is oriented to person, place, and time and well-developed, well-nourished, and in no distress.  HENT:  Head: Normocephalic and atraumatic.  Right Ear: Tympanic membrane, external ear and ear canal normal.  Left Ear: Tympanic membrane, external ear and ear canal normal.  Nose: Nose normal. No mucosal edema.  Mouth/Throat: Uvula is midline, oropharynx is clear and moist and mucous membranes are normal. No oropharyngeal exudate or posterior oropharyngeal erythema.  Eyes: Conjunctivae are normal. Pupils are equal, round, and reactive to light.  Neck: Neck supple. No thyromegaly present.  Cardiovascular: Normal rate, regular rhythm, normal heart sounds and intact distal pulses.   Pulmonary/Chest: Effort normal and breath sounds normal. No respiratory distress. She has no wheezes. She has no rales.  Abdominal: Soft. Bowel sounds are normal. She exhibits no distension and no mass. There is no tenderness. There is no rebound and no guarding.  Lymphadenopathy:    She has no cervical adenopathy.  Neurological: She is alert and oriented to person, place, and time. No cranial nerve deficit.  Skin: Skin is warm and dry. No rash noted.  Psychiatric: Affect normal. Her mood appears anxious. She expresses no homicidal and no suicidal ideation. She expresses no suicidal  plans and no homicidal plans.  Vitals reviewed.  Assessment/Plan: Visit for preventive health examination Depression screen negative. Health Maintenance reviewed -- immunizations and PAP up-do-date. Due for mammogram. Order placed. Preventive schedule discussed and handout given in AVS. Will obtain fasting labs today.   Depressive disorder Will increase Sertraline to 50 mg daily.  Alprazolam as directed for acute anxiety only.  Counseling discussed but patient does not have time at present. Follow-up scheduled.   Breast cancer screening Order for screening mammogram placed. Faxed to [facility] due to patient preference.     Piedad Climes, PA-C

## 2016-08-16 NOTE — Assessment & Plan Note (Signed)
Will increase Sertraline to 50 mg daily.  Alprazolam as directed for acute anxiety only.  Counseling discussed but patient does not have time at present. Follow-up scheduled.

## 2016-08-16 NOTE — Assessment & Plan Note (Signed)
Order for screening mammogram placed. Faxed to [facility] due to patient preference.

## 2016-08-16 NOTE — Patient Instructions (Signed)
Please go to the lab for blood work.   Our office will call you with your results unless you have chosen to receive results via MyChart.  If your blood work is normal we will follow-up each year for physicals and as scheduled for chronic medical problems.  If anything is abnormal we will treat accordingly and get you in for a follow-up.  Please take the new dose of Sertraline that we sent in. Use Xanax sparingly for panic attack only. Do not drive or operate machinery if taking the medication.  You will be contacted to schedule your mammogram.  Follow-up with me in 3-4 weeks.   Preventive Care 40-64 Years, Female Preventive care refers to lifestyle choices and visits with your health care provider that can promote health and wellness. What does preventive care include?  A yearly physical exam. This is also called an annual well check.  Dental exams once or twice a year.  Routine eye exams. Ask your health care provider how often you should have your eyes checked.  Personal lifestyle choices, including:  Daily care of your teeth and gums.  Regular physical activity.  Eating a healthy diet.  Avoiding tobacco and drug use.  Limiting alcohol use.  Practicing safe sex.  Taking low-dose aspirin daily starting at age 30.  Taking vitamin and mineral supplements as recommended by your health care provider. What happens during an annual well check? The services and screenings done by your health care provider during your annual well check will depend on your age, overall health, lifestyle risk factors, and family history of disease. Counseling  Your health care provider may ask you questions about your:  Alcohol use.  Tobacco use.  Drug use.  Emotional well-being.  Home and relationship well-being.  Sexual activity.  Eating habits.  Work and work Statistician.  Method of birth control.  Menstrual cycle.  Pregnancy history. Screening  You may have the  following tests or measurements:  Height, weight, and BMI.  Blood pressure.  Lipid and cholesterol levels. These may be checked every 5 years, or more frequently if you are over 59 years old.  Skin check.  Lung cancer screening. You may have this screening every year starting at age 61 if you have a 30-pack-year history of smoking and currently smoke or have quit within the past 15 years.  Fecal occult blood test (FOBT) of the stool. You may have this test every year starting at age 35.  Flexible sigmoidoscopy or colonoscopy. You may have a sigmoidoscopy every 5 years or a colonoscopy every 10 years starting at age 66.  Hepatitis C blood test.  Hepatitis B blood test.  Sexually transmitted disease (STD) testing.  Diabetes screening. This is done by checking your blood sugar (glucose) after you have not eaten for a while (fasting). You may have this done every 1-3 years.  Mammogram. This may be done every 1-2 years. Talk to your health care provider about when you should start having regular mammograms. This may depend on whether you have a family history of breast cancer.  BRCA-related cancer screening. This may be done if you have a family history of breast, ovarian, tubal, or peritoneal cancers.  Pelvic exam and Pap test. This may be done every 3 years starting at age 81. Starting at age 75, this may be done every 5 years if you have a Pap test in combination with an HPV test.  Bone density scan. This is done to screen for osteoporosis. You may  have this scan if you are at high risk for osteoporosis. Discuss your test results, treatment options, and if necessary, the need for more tests with your health care provider. Vaccines  Your health care provider may recommend certain vaccines, such as:  Influenza vaccine. This is recommended every year.  Tetanus, diphtheria, and acellular pertussis (Tdap, Td) vaccine. You may need a Td booster every 10 years.  Varicella vaccine. You  may need this if you have not been vaccinated.  Zoster vaccine. You may need this after age 51.  Measles, mumps, and rubella (MMR) vaccine. You may need at least one dose of MMR if you were born in 1957 or later. You may also need a second dose.  Pneumococcal 13-valent conjugate (PCV13) vaccine. You may need this if you have certain conditions and were not previously vaccinated.  Pneumococcal polysaccharide (PPSV23) vaccine. You may need one or two doses if you smoke cigarettes or if you have certain conditions.  Meningococcal vaccine. You may need this if you have certain conditions.  Hepatitis A vaccine. You may need this if you have certain conditions or if you travel or work in places where you may be exposed to hepatitis A.  Hepatitis B vaccine. You may need this if you have certain conditions or if you travel or work in places where you may be exposed to hepatitis B.  Haemophilus influenzae type b (Hib) vaccine. You may need this if you have certain conditions. Talk to your health care provider about which screenings and vaccines you need and how often you need them. This information is not intended to replace advice given to you by your health care provider. Make sure you discuss any questions you have with your health care provider. Document Released: 05/06/2015 Document Revised: 12/28/2015 Document Reviewed: 02/08/2015 Elsevier Interactive Patient Education  2017 [employer].

## 2016-08-16 NOTE — Progress Notes (Signed)
Pre visit review using our clinic review tool, if applicable. No additional management support is needed unless otherwise documented below in the visit note. 

## 2016-08-16 NOTE — Assessment & Plan Note (Signed)
Depression screen negative. Health Maintenance reviewed -- immunizations and PAP up-do-date. Due for mammogram. Order placed. Preventive schedule discussed and handout given in AVS. Will obtain fasting labs today.

## 2016-08-17 ENCOUNTER — Encounter: Payer: Self-pay | Admitting: Emergency Medicine

## 2016-09-25 ENCOUNTER — Other Ambulatory Visit: Payer: Self-pay | Admitting: Physician Assistant

## 2016-09-25 DIAGNOSIS — F329 Major depressive disorder, single episode, unspecified: Secondary | ICD-10-CM

## 2016-09-25 DIAGNOSIS — F32A Depression, unspecified: Secondary | ICD-10-CM

## 2016-09-25 NOTE — Telephone Encounter (Signed)
Xanax last rx 08/16/16 #30 No CSC No UDS Last office visit: 08/16/16 CPE  Please advise in PCP absence

## 2016-09-26 ENCOUNTER — Encounter: Payer: Self-pay | Admitting: Emergency Medicine

## 2016-09-26 NOTE — Telephone Encounter (Signed)
Ok for #30 no refills.  Will need to sign controlled substance agreement as this is 2nd script in 2 months

## 2016-09-26 NOTE — Telephone Encounter (Signed)
Advised patient that rx is ready up front for pick up. She will need to sign a CSC as well. Patient will pick up at appointment tomorrow

## 2016-09-27 ENCOUNTER — Ambulatory Visit (INDEPENDENT_AMBULATORY_CARE_PROVIDER_SITE_OTHER): Payer: BLUE CROSS/BLUE SHIELD | Admitting: Family Medicine

## 2016-09-27 ENCOUNTER — Encounter: Payer: Self-pay | Admitting: Family Medicine

## 2016-09-27 VITALS — BP 123/83 | HR 68 | Temp 98.1°F | Resp 16 | Ht 66.0 in | Wt 171.4 lb

## 2016-09-27 DIAGNOSIS — F329 Major depressive disorder, single episode, unspecified: Secondary | ICD-10-CM | POA: Diagnosis not present

## 2016-09-27 DIAGNOSIS — F32A Depression, unspecified: Secondary | ICD-10-CM

## 2016-09-27 DIAGNOSIS — J0140 Acute pansinusitis, unspecified: Secondary | ICD-10-CM | POA: Diagnosis not present

## 2016-09-27 MED ORDER — CETIRIZINE HCL 10 MG PO TABS: 10.0000 mg | ORAL_TABLET | Freq: Every day | ORAL | 11 refills | Status: DC

## 2016-09-27 MED ORDER — FLUTICASONE PROPIONATE 50 MCG/ACT NA SUSP: 2.0000 | Freq: Every day | NASAL | 6 refills | Status: DC

## 2016-09-27 NOTE — Patient Instructions (Signed)
Follow up by phone or MyChart early next week and let us know how you're doing Continue the Zoloft daily for the anxiety/depression Try a 1/2 tab of alprazolam as needed for panicked moments Start Zyrtec once daily Continue the Flonase- 2 sprays each nostril Drink plenty of fluids Tylenol or ibuprofen as needed for headache or pain Call with any questions or concerns Hang in there!  You're doing great!!!

## 2016-09-27 NOTE — Progress Notes (Signed)
   Subjective:    Patient ID: Judy ChartersStephanie Walters, female    DOB: January 19, 1976, 41 y.o.   MRN: [id number]  HPI 'HAs but it's more like facial'- sxs started a few days ago.  Described as pressure.  Had fluid in ear but used allergy medication (Flonase) and this improved.  No sneezing, drainage from eyes, denies nasal congestion.  + tooth pain.  Pt took 1 box of OTC antihistamine.  'break down'- pt reports that Tuesday afternoon she went in her office and cried.  Attempted to get herself together but when someone asked her a question later, she again started to cry.  This happened 3 times.  Cried again yesterday AM.  Called out of work.  sxs are again present today.  Pt went off Zoloft for a few days but restarted Monday.  475 yo son was dx'd w/ Autism in January and pt is having a very hard time.  She is a single mom w/ 5 kids.  Denies thoughts of hurting self or others but admits to increased irritability w/ kids.   Review of Systems For ROS see HPI     Objective:   Physical Exam  Constitutional: She is oriented to person, place, and time. She appears well-developed and well-nourished. No distress.  HENT:  Head: Normocephalic and atraumatic.  Right Ear: Tympanic membrane normal.  Left Ear: Tympanic membrane normal.  Nose: Mucosal edema and rhinorrhea present. Right sinus exhibits no maxillary sinus tenderness and no frontal sinus tenderness. Left sinus exhibits no maxillary sinus tenderness and no frontal sinus tenderness.  Mouth/Throat: Mucous membranes are normal. Posterior oropharyngeal erythema (w/ PND) present.  Eyes: Conjunctivae and EOM are normal. Pupils are equal, round, and reactive to light.  Neck: Normal range of motion. Neck supple.  Cardiovascular: Normal rate, regular rhythm and normal heart sounds.   Pulmonary/Chest: Effort normal and breath sounds normal. No respiratory distress. She has no wheezes. She has no rales.  Lymphadenopathy:    She has no cervical adenopathy.    Neurological: She is alert and oriented to person, place, and time.  Skin: Skin is warm and dry.  Psychiatric: She has a normal mood and affect. Her behavior is normal. Thought content normal.  Vitals reviewed.         Assessment & Plan:  Sinus inflammation- new.  No evidence of infxn on PE.  sxs are consistent w/ inflammation and no abx required.  Due to increased irritability and anxiety will not start prednisone.  Start daily nasal steroid and daily antihistamine.  Reviewed supportive care and red flags that should prompt return.  Pt expressed understanding and is in agreement w/ plan.   Depression- deteriorated.  Pt stopped Zoloft for 3-4 days and then restarted.  We discussed that this allowed the medication to wash out of her system and she now needs to allow this to build back up in her system.  Discussed possibility of increasing dose but pt felt the 50mg  dose was quite effective prior to running out of it.  Pt denies SI/HI and is able to contract for safety.  Reviewed appropriate use of Alprazolam.  Pt to f/u early next week and let us know how she is doing.  Pt expressed understanding and is in agreement w/ plan.

## 2016-09-27 NOTE — Progress Notes (Signed)
Pre visit review using our clinic review tool, if applicable. No additional management support is needed unless otherwise documented below in the visit note. 

## 2016-11-02 ENCOUNTER — Other Ambulatory Visit: Payer: Self-pay | Admitting: Family Medicine

## 2016-11-02 ENCOUNTER — Encounter: Payer: Self-pay | Admitting: Emergency Medicine

## 2016-11-02 DIAGNOSIS — F329 Major depressive disorder, single episode, unspecified: Secondary | ICD-10-CM

## 2016-11-02 DIAGNOSIS — F32A Depression, unspecified: Secondary | ICD-10-CM

## 2016-11-02 NOTE — Telephone Encounter (Signed)
Last rx 09/26/16 #30 Last OV: 08/16/16 CSC signed 09/26/16  Please advise

## 2016-11-02 NOTE — Telephone Encounter (Signed)
Rx printed and signed. Ok to fax.  Needs follow-up before further fills.

## 2016-11-02 NOTE — Telephone Encounter (Signed)
Rx faxed to the pharmacy and My chart message sent for reminder of appointment

## 2016-11-20 ENCOUNTER — Ambulatory Visit (INDEPENDENT_AMBULATORY_CARE_PROVIDER_SITE_OTHER): Payer: BLUE CROSS/BLUE SHIELD | Admitting: Physician Assistant

## 2016-11-20 ENCOUNTER — Encounter: Payer: Self-pay | Admitting: Physician Assistant

## 2016-11-20 ENCOUNTER — Ambulatory Visit (INDEPENDENT_AMBULATORY_CARE_PROVIDER_SITE_OTHER): Payer: BLUE CROSS/BLUE SHIELD

## 2016-11-20 VITALS — BP 121/80 | HR 82 | Temp 97.9°F | Resp 16 | Ht 66.0 in | Wt 174.1 lb

## 2016-11-20 DIAGNOSIS — F329 Major depressive disorder, single episode, unspecified: Secondary | ICD-10-CM | POA: Diagnosis not present

## 2016-11-20 DIAGNOSIS — M5440 Lumbago with sciatica, unspecified side: Secondary | ICD-10-CM

## 2016-11-20 DIAGNOSIS — G8929 Other chronic pain: Secondary | ICD-10-CM

## 2016-11-20 DIAGNOSIS — F32A Depression, unspecified: Secondary | ICD-10-CM

## 2016-11-20 MED ORDER — ALPRAZOLAM 0.25 MG PO TABS: 0.2500 mg | ORAL_TABLET | Freq: Two times a day (BID) | ORAL | 0 refills | Status: DC | PRN

## 2016-11-20 MED ORDER — TRAZODONE HCL 50 MG PO TABS: 25.0000 mg | ORAL_TABLET | Freq: Every day | ORAL | 0 refills | Status: DC

## 2016-11-20 MED ORDER — METHYLPREDNISOLONE 4 MG PO TBPK: ORAL_TABLET | ORAL | 0 refills | Status: DC

## 2016-11-20 MED ORDER — METHOCARBAMOL 500 MG PO TABS: 500.0000 mg | ORAL_TABLET | Freq: Three times a day (TID) | ORAL | 0 refills | Status: DC

## 2016-11-20 NOTE — Patient Instructions (Signed)
Please avoid heavy lifting and overexertion. Start the steroid taper as directed. Take the Robaxin each evening. You can take up to three time a day, but do not drive while taking this medication. Take Tylenol arthritis for breakthrough pain.   Please go to our [place] office for x-ray. The ladies at the front desk will get you directions. I will call you with your results.   I have given a refill of the Xanax to use only on a rare basis for acute anxiety.  Once we finish the steroid taper, ok to start the Trazodone instead of Xanax for sleep.   Follow-up with

## 2016-11-20 NOTE — Progress Notes (Signed)
Pre visit review using our clinic review tool, if applicable. No additional management support is needed unless otherwise documented below in the visit note. 
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
## 2017-01-09 ENCOUNTER — Telehealth: Payer: Self-pay | Admitting: Physician Assistant

## 2017-01-09 NOTE — Telephone Encounter (Signed)
PHQ 9 updated. Score of 1. Is doing well with current medication regimen. Follow-up scheduled.

## 2017-01-29 ENCOUNTER — Ambulatory Visit (INDEPENDENT_AMBULATORY_CARE_PROVIDER_SITE_OTHER): Payer: BLUE CROSS/BLUE SHIELD | Admitting: Physician Assistant

## 2017-01-29 ENCOUNTER — Telehealth: Payer: Self-pay | Admitting: Physician Assistant

## 2017-01-29 ENCOUNTER — Encounter: Payer: Self-pay | Admitting: Physician Assistant

## 2017-01-29 VITALS — BP 92/60 | HR 75 | Temp 97.9°F | Resp 14 | Ht 66.0 in | Wt 170.0 lb

## 2017-01-29 DIAGNOSIS — R1013 Epigastric pain: Secondary | ICD-10-CM | POA: Diagnosis not present

## 2017-01-29 LAB — COMPREHENSIVE METABOLIC PANEL
ALBUMIN: 4.2 g/dL (ref 3.5–5.2)
ALK PHOS: 78 U/L (ref 39–117)
ALT: 11 U/L (ref 0–35)
AST: 14 U/L (ref 0–37)
BILIRUBIN TOTAL: 0.4 mg/dL (ref 0.2–1.2)
BUN: 9 mg/dL (ref 6–23)
CO2: 29 mEq/L (ref 19–32)
Calcium: 9.4 mg/dL (ref 8.4–10.5)
Chloride: 106 mEq/L (ref 96–112)
Creatinine, Ser: 0.6 mg/dL (ref 0.40–1.20)
GFR: 115.81 mL/min (ref >60.00)
Glucose, Bld: 79 mg/dL (ref 70–99)
POTASSIUM: 4.5 meq/L (ref 3.5–5.1)
SODIUM: 139 meq/L (ref 135–145)
TOTAL PROTEIN: 6.6 g/dL (ref 6.0–8.3)

## 2017-01-29 LAB — CBC WITH DIFFERENTIAL/PLATELET
BASOS PCT: 0.5 % (ref 0.0–3.0)
Basophils Absolute: 0 10*3/uL (ref 0.0–0.1)
EOS ABS: 0.2 10*3/uL (ref 0.0–0.7)
EOS PCT: 3.3 % (ref 0.0–5.0)
HEMATOCRIT: 41.8 % (ref 36.0–46.0)
Hemoglobin: 13.9 g/dL (ref 12.0–15.0)
LYMPHS PCT: 31.3 % (ref 12.0–46.0)
Lymphs Abs: 2.1 10*3/uL (ref 0.7–4.0)
MCHC: 33.1 g/dL (ref 30.0–36.0)
MCV: 87.6 fl (ref 78.0–100.0)
MONOS PCT: 11.2 % (ref 3.0–12.0)
Monocytes Absolute: 0.7 10*3/uL (ref 0.1–1.0)
NEUTROS ABS: 3.5 10*3/uL (ref 1.4–7.7)
Neutrophils Relative %: 53.7 % (ref 43.0–77.0)
PLATELETS: 214 10*3/uL (ref 150.0–400.0)
RBC: 4.78 Mil/uL (ref 3.87–5.11)
RDW: 13.9 % (ref 11.5–15.5)
WBC: 6.5 10*3/uL (ref 4.0–10.5)

## 2017-01-29 LAB — LIPASE

## 2017-01-29 LAB — H. PYLORI ANTIBODY, IGG: H Pylori IgG: NEGATIVE

## 2017-01-29 MED ORDER — PANTOPRAZOLE SODIUM 40 MG PO TBEC: 40.0000 mg | DELAYED_RELEASE_TABLET | Freq: Every day | ORAL | 3 refills | Status: DC

## 2017-01-29 MED ORDER — SUCRALFATE 1 G PO TABS: 1.0000 g | ORAL_TABLET | Freq: Three times a day (TID) | ORAL | 0 refills | Status: DC

## 2017-01-29 NOTE — Telephone Encounter (Signed)
Patient Name: Judy Walters  DOB: 12/10/1975    Initial Comment Caller states she is having severe abdominal pain it started on Sunday. It is not constant, clinic wants her triaged.    Nurse Assessment  Nurse: Stefano Gaul, RN, Dwana Curd Date/Time (Eastern Time): 01/29/2017 8:50:42 AM  Confirm and document reason for call. If symptomatic, describe symptoms. ---Caller states she has been having severe abd pain since Sunday night. Has upper abd pain right under the sternum. She is nauseated. Pain is intermittent. No vomiting. No pain right now. pain level 10 when she has it.  Does the patient have any new or worsening symptoms? ---Yes  Will a triage be completed? ---Yes  Related visit to physician within the last 2 weeks? ---No  Does the PT have any chronic conditions? (i.e. diabetes, asthma, etc.) ---No  Is the patient pregnant or possibly pregnant? (Ask all females between the ages of 65-55) ---No  Is this a behavioral health or substance abuse call? ---No     Guidelines    Guideline Title Affirmed Question Affirmed Notes  Abdominal Pain - Upper [1] MODERATE pain (e.g., interferes with normal activities) AND [2] comes and goes (cramps) AND [3] present > 24 hours (Exception: pain with Vomiting or Diarrhea - see that Guideline)   Abdominal Pain - Upper [1] MODERATE pain (e.g., interferes with normal activities) AND [2] comes and goes (cramps) AND [3] present > 24 hours (Exception: pain with Vomiting or Diarrhea - see that Guideline)    Final Disposition User   See Physician within 24 Hours [place], RN, Vera    Comments  appt scheduled for 01/29/2017 at 2:15 pm with Malva Cogan at [facility]   Referrals  REFERRED TO PCP OFFICE  REFERRED TO PCP OFFICE   Caller Disagree/Comply Comply  Caller Understands Yes  PreDisposition Call Doctor

## 2017-01-29 NOTE — Progress Notes (Signed)
Pre visit review using our clinic review tool, if applicable. No additional management support is needed unless otherwise documented below in the visit note. 

## 2017-01-29 NOTE — Progress Notes (Signed)
Patient presents to clinic today c/o nausea starting Sunday followed by pain in the epigastric region. Endorses pain is worse with food and fluid intake. Pain is intermittent but sharp and stabbing. Endorses some SOB when pain is present. Denies vomiting or change to bowel habits. Last period ended Sunday.  Past Medical History:  Diagnosis Date  . Asthma   . Depression   . Frequent headaches   . Heart murmur   . History of chicken pox   . 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 Shingles    5 at onset  . Syncope   . TIA (transient ischemic attack) 2013    Current Outpatient Prescriptions on File Prior to Visit  Medication Sig Dispense Refill  . ALPRAZolam (XANAX) 0.25 MG tablet Take 1 tablet (0.25 mg total) by mouth 2 (two) times daily as needed. for anxiety 30 tablet 0  . cetirizine (ZYRTEC) 10 MG tablet Take 1 tablet (10 mg total) by mouth daily. 30 tablet 11  . fluticasone (FLONASE) 50 MCG/ACT nasal spray Place 2 sprays into both nostrils daily. 16 g 6  . Linaclotide (LINZESS) 290 MCG CAPS capsule 1 tablet once daily.    . sertraline (ZOLOFT) 50 MG tablet Take 1 tablet (50 mg total) by mouth daily. 30 tablet 3  . traZODone (DESYREL) 50 MG tablet Take 0.5 tablets (25 mg total) by mouth at bedtime. 30 tablet 0   No current facility-administered medications on file prior to visit.     Allergies  Allergen Reactions  . Tinidazole Hives and Rash  . Betadine [Povidone Iodine] Itching and Rash    Family History  Problem Relation Age of Onset  . Hypertension Mother        Living  . Breast cancer Mother   . Leukemia Mother   . Bipolar disorder Mother   . COPD Father   . Diabetes Father   . Heart disease Father 32       Deceased  . Congestive Heart Failure Father   . Diabetes Sister   . Colon cancer Maternal Grandfather   . Lung cancer Paternal Grandmother   . Diabetes Paternal Grandmother   . Pancreatic cancer Paternal Uncle   . COPD Paternal Uncle   .  Congestive Heart Failure Paternal Uncle   . Heart defect Paternal Uncle   . Stroke Paternal Uncle   . Breast cancer Maternal Aunt        x2  . Stroke Maternal Aunt   . Lung cancer Maternal Aunt   . Diabetes Sister   . Obesity Sister   . Diabetes Other        Paternal-side  . Heart murmur Son   . Healthy Daughter   . Asthma Son        #2  . Seizures Son        #2  . Behavior problems Son        #2    Social History   Social History  . Marital status: Single    Spouse name: N/A  . Number of children: 5  . Years of education: N/A   Social History Main Topics  . Smoking status: Current Every Day Smoker    Packs/day: 1.00    Years: 15.00    Types: Cigarettes  . Smokeless tobacco: Never Used  . Alcohol use No  . Drug use: No  . Sexual activity: Yes    Birth control/ protection: Surgical   Other Topics  Concern  . None   Social History Narrative  . None   Review of Systems - See HPI.  All other ROS are negative.  BP 92/60   Pulse 75   Temp 97.9 F (36.6 C) (Oral)   Resp 14   Ht '5\' 6"'  (1.676 m)   Wt 170 lb (77.1 kg)   SpO2 99%   BMI 27.44 kg/m   Physical Exam  Constitutional: She is oriented to person, place, and time and well-developed, well-nourished, and in no distress.  HENT:  Head: Normocephalic and atraumatic.  Eyes: Conjunctivae are normal.  Neck: Neck supple.  Cardiovascular: Normal rate, regular rhythm, normal heart sounds and intact distal pulses.   Pulmonary/Chest: Effort normal and breath sounds normal. No respiratory distress. She has no wheezes. She has no rales. She exhibits no tenderness.  Abdominal: Soft. Bowel sounds are normal. There is no hepatosplenomegaly. There is tenderness in the epigastric area and left upper quadrant. There is no CVA tenderness.  Neurological: She is alert and oriented to person, place, and time.  Skin: Skin is warm and dry. No rash noted.  Psychiatric: Affect normal.  Vitals reviewed.  Assessment/Plan: 1.  Epigastric pain Seems most consistent with gastritis or ulcer. No blood in stool per patient. Will check labs today to include CBC, CMP, Lipase and H. Pylori. Start Protonix and Carafate. Will refer to GI pending results. Strict ER precautions discussed.  - CBC w/Diff - Comp Met (CMET) - Lipase - H. pylori antibody, IgG - pantoprazole (PROTONIX) 40 MG tablet; Take 1 tablet (40 mg total) by mouth daily.  Dispense: 30 tablet; Refill: 3 - sucralfate (CARAFATE) 1 g tablet; Take 1 tablet (1 g total) by mouth 4 (four) times daily -  with meals and at bedtime.  Dispense: 60 tablet; Refill: 0   Leeanne Rio, [place]

## 2017-01-29 NOTE — Patient Instructions (Signed)
Please start the Protonix daily. Also take the Carafate as directed to coat your stomach. Keep hydrated.  Please go to the lab for blood work. I will call you with your results. We will alter regimen accoridingly.  We may need to set you up with GI.  If anything worsens, please go to the ER.

## 2017-01-29 NOTE — Telephone Encounter (Signed)
PCP aware. On schedule for today

## 2017-01-30 ENCOUNTER — Encounter: Payer: Self-pay | Admitting: Nurse Practitioner

## 2017-01-30 ENCOUNTER — Other Ambulatory Visit: Payer: Self-pay | Admitting: Physician Assistant

## 2017-01-30 ENCOUNTER — Telehealth: Payer: Self-pay | Admitting: Physician Assistant

## 2017-01-30 DIAGNOSIS — R1013 Epigastric pain: Secondary | ICD-10-CM

## 2017-01-30 MED ORDER — OMEPRAZOLE 40 MG PO CPDR: 40.0000 mg | DELAYED_RELEASE_CAPSULE | Freq: Every day | ORAL | 3 refills | Status: DC

## 2017-01-30 NOTE — Telephone Encounter (Signed)
Disregard message

## 2017-01-30 NOTE — Addendum Note (Signed)
Addended by: Con Memos on: 01/30/2017 11:16 AM   Modules accepted: Orders

## 2017-02-12 ENCOUNTER — Ambulatory Visit (INDEPENDENT_AMBULATORY_CARE_PROVIDER_SITE_OTHER): Payer: BLUE CROSS/BLUE SHIELD | Admitting: Nurse Practitioner

## 2017-02-12 ENCOUNTER — Encounter: Payer: Self-pay | Admitting: Nurse Practitioner

## 2017-02-12 VITALS — BP 118/72 | HR 68 | Ht 65.5 in | Wt 174.0 lb

## 2017-02-12 DIAGNOSIS — R110 Nausea: Secondary | ICD-10-CM | POA: Diagnosis not present

## 2017-02-12 DIAGNOSIS — R1310 Dysphagia, unspecified: Secondary | ICD-10-CM | POA: Diagnosis not present

## 2017-02-12 DIAGNOSIS — K5909 Other constipation: Secondary | ICD-10-CM | POA: Diagnosis not present

## 2017-02-12 DIAGNOSIS — R140 Abdominal distension (gaseous): Secondary | ICD-10-CM | POA: Diagnosis not present

## 2017-02-12 DIAGNOSIS — Z791 Long term (current) use of non-steroidal anti-inflammatories (NSAID): Secondary | ICD-10-CM | POA: Diagnosis not present

## 2017-02-12 DIAGNOSIS — R109 Unspecified abdominal pain: Secondary | ICD-10-CM

## 2017-02-12 MED ORDER — LUBIPROSTONE 8 MCG PO CAPS: 8.0000 ug | ORAL_CAPSULE | Freq: Two times a day (BID) | ORAL | 2 refills | Status: DC

## 2017-02-12 NOTE — Patient Instructions (Addendum)
If you are age 43 or older, your body mass index 61should be between 23-30. Your Body mass index is 28.51 kg/m. If this is out of the aforementioned range listed, please consider follow up with your Primary Care Provider.  If you are age 43 or younger, your body mass index should be between 19-25. Your Body mass index is 28.51 kg/m. If this is out of the aformentioned range listed, please consider follow up with your Primary Care Provider.   We have sent the following medications to your pharmacy for you to pick up at your convenience: Amitiza  Start VSL - (over-the-counter) Take as directed.  STOP Linzess.  NO Goodys or other NSAIDS.  You have been given a Low Gas Diet.   Follow up on March 12, 2017 at 8:30 am with Willette ClusterPaula Guenther, NP.  Thank you for choosing me and [facility].   Willette ClusterPaula Guenther, NP

## 2017-02-12 NOTE — Progress Notes (Signed)
HPI:  This is a 41 year old female, new to the practice, referred by PCP Marcelline MatesWilliam Martin, PA for evaluation of epigastric pain. Pain is sharp with radiation into her back. It started started a few weeks ago, is worse after PO intake. She has also has some intermittent nausea without vomiting. She has a recent history of heavy NSAID use ( Goody powders).  No weight loss, she has actually gained. PCPstarted her on Carafate and daily Protonix around 01/29/17. She stopped NSAIDs. Her symptoms have significantly improved.   She takes Linzess for chronic constipation. It was started by GI in [facility] a few years back. She did not have any endoscopic procedures done at the practice. Linzess gives her variable results but mostly diarrhea  Diagnostic studies Labs dated 01/29/17 included normal CBC, normal chemistry profile, normal lipase. H pylori IgG negative. Of note, an ultrasound for left upper quadrant pain April 2017 was unremarkabl  Past Medical History:  Diagnosis Date  . Asthma   . Depression   . Frequent headaches   . Heart murmur   . History of chicken pox   . 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 Shingles    3 at onset  . Syncope   . TIA (transient ischemic attack) 2013     Past Surgical History:  Procedure Laterality Date  . APPENDECTOMY  1989  . carpel tunnel  2012  . CESAREAN SECTION  2012  . TONSILLECTOMY  1985  . TUBAL LIGATION    . WISDOM TOOTH EXTRACTION     Family History  Problem Relation Age of Onset  . Hypertension Mother        Living  . Breast cancer Mother   . Leukemia Mother   . Bipolar disorder Mother   . COPD Father   . Diabetes Father   . Heart disease Father [id number]       Deceased  . Congestive Heart Failure Father   . Diabetes Sister   . Colon cancer Maternal Grandfather   . Lung cancer Paternal Grandmother   . Diabetes Paternal Grandmother   . Pancreatic cancer Paternal Uncle   . COPD Paternal Uncle   . Congestive Heart  Failure Paternal Uncle   . Heart defect Paternal Uncle   . Stroke Paternal Uncle   . Breast cancer Maternal Aunt        x2  . Stroke Maternal Aunt   . Lung cancer Maternal Aunt   . Diabetes Sister   . Obesity Sister   . Diabetes Other        Paternal-side  . Heart murmur Son   . Healthy Daughter   . Asthma Son        #2  . Seizures Son        #2  . Behavior problems Son        #2   Social History  Substance Use Topics  . Smoking status: Current Every Day Smoker    Packs/day: 1.00    Years: 15.00    Types: Cigarettes  . Smokeless tobacco: Never Used  . Alcohol use No   Current Outpatient Prescriptions  Medication Sig Dispense Refill  . ALPRAZolam (XANAX) 0.25 MG tablet Take 1 tablet (0.25 mg total) by mouth 2 (two) times daily as needed. for anxiety 30 tablet 0  . cetirizine (ZYRTEC) 10 MG tablet Take 1 tablet (10 mg total) by mouth daily. 30 tablet 11  . fluticasone (FLONASE) 50  MCG/ACT nasal spray Place 2 sprays into both nostrils daily. 16 g 6  . Linaclotide (LINZESS) 290 MCG CAPS capsule 1 tablet once daily.    Marland Kitchen omeprazole (PRILOSEC) 40 MG capsule Take 1 capsule (40 mg total) by mouth daily. 30 capsule 3  . sertraline (ZOLOFT) 50 MG tablet Take 1 tablet (50 mg total) by mouth daily. 30 tablet 3  . sucralfate (CARAFATE) 1 g tablet Take 1 tablet (1 g total) by mouth 4 (four) times daily -  with meals and at bedtime. 60 tablet 0  . traZODone (DESYREL) 50 MG tablet Take 0.5 tablets (25 mg total) by mouth at bedtime. 30 tablet 0   No current facility-administered medications for this visit.    Allergies  Allergen Reactions  . Tinidazole Hives and Rash  . Betadine [Povidone Iodine] Itching and Rash     Review of Systems: positive for anxiety and fatigue All systems reviewed and negative except where noted in HPI.    Physical Exam: BP 118/72   Pulse 68   Ht 5' 5.5" (1.664 m)   Wt 174 lb (78.9 kg)   BMI 28.51 kg/m  Constitutional:  Well-developed, white female  in no acute distress. Psychiatric: Normal mood and affect. Behavior is normal. EENT: Pupils normal.  Conjunctivae are normal. No scleral icterus. Neck supple.  Cardiovascular: Normal rate, regular rhythm. No edema Pulmonary/chest: Effort normal and breath sounds normal. No wheezing, rales or rhonchi. Abdominal: Soft, nondistended. Nontender. Bowel sounds active throughout. There are no masses palpable. No hepatomegaly. Lymphadenopathy: No cervical adenopathy noted. Neurological: Alert and oriented to person place and time. Skin: Skin is warm and dry. No rashes noted.   ASSESSMENT AND PLAN: 41 year old female with several week history of nausea and epigastric pain radiating through to back in setting of recent heavy NSAID use (BC and Goody powders). Labs including H.pylori serology are unremarkable.  Ultrasound April 2017 (for LUQ pain) negative for cholelithiasis.  She certainly could have PUD secondary to heavy/long-term NSAID use. Epigastric pain is worse when stressed/ frustrated so cannot totally rule out functional etiology  -She is improving with Carafate and daily PPI started by PCP. -continue daily PPI before meal. Continue Carafate but can reduce dose as symptoms continue to improve -Follow-up with me in 4-6 weeks. Goal is to get her off PPI at some point.  -Call us in the interim if worsening. EGD if she does not continue to improve -Avoid all anti-inflammatories for now  Chronic constipation. She gets watery stools on Linzess. -stop Linzess -She is interested in a trial of Amitiza 8 mics twice a day with food  Chronic bloating.. She tries to eat very healthy including high fiber dit which in fact may be contributed to the bloating - Trial of of VSL #3. She doesn't have diarrhea but may help with bloating. -low gas diet brochure.     Willette Cluster, NP  02/12/2017, 9:25 AM  Cc:  Waldon Merl, PA-C

## 2017-02-13 ENCOUNTER — Other Ambulatory Visit: Payer: Self-pay | Admitting: Physician Assistant

## 2017-02-13 ENCOUNTER — Other Ambulatory Visit: Payer: Self-pay

## 2017-02-13 ENCOUNTER — Telehealth: Payer: Self-pay

## 2017-02-13 ENCOUNTER — Telehealth: Payer: Self-pay | Admitting: Nurse Practitioner

## 2017-02-13 DIAGNOSIS — F32A Depression, unspecified: Secondary | ICD-10-CM

## 2017-02-13 DIAGNOSIS — F329 Major depressive disorder, single episode, unspecified: Secondary | ICD-10-CM

## 2017-02-13 MED ORDER — VSL#3 PO CAPS: ORAL_CAPSULE | ORAL | 1 refills | Status: DC

## 2017-02-13 NOTE — Telephone Encounter (Signed)
VSL # 3 450 three times daily #90 with 1 [facility] [place]

## 2017-02-18 NOTE — Progress Notes (Signed)
I agree with the above note, plan 

## 2017-02-22 LAB — HM MAMMOGRAPHY

## 2017-03-12 ENCOUNTER — Encounter: Payer: Self-pay | Admitting: Nurse Practitioner

## 2017-03-12 ENCOUNTER — Ambulatory Visit (INDEPENDENT_AMBULATORY_CARE_PROVIDER_SITE_OTHER): Payer: BLUE CROSS/BLUE SHIELD | Admitting: Nurse Practitioner

## 2017-03-12 VITALS — BP 100/60 | HR 66 | Ht 66.0 in | Wt 174.4 lb

## 2017-03-12 DIAGNOSIS — R110 Nausea: Secondary | ICD-10-CM

## 2017-03-12 DIAGNOSIS — R6881 Early satiety: Secondary | ICD-10-CM

## 2017-03-12 DIAGNOSIS — K5909 Other constipation: Secondary | ICD-10-CM

## 2017-03-12 DIAGNOSIS — R140 Abdominal distension (gaseous): Secondary | ICD-10-CM | POA: Diagnosis not present

## 2017-03-12 MED ORDER — VSL#3 PO CAPS: ORAL_CAPSULE | ORAL | 3 refills | Status: DC

## 2017-03-12 NOTE — Patient Instructions (Signed)
If you are age 43 or older, your body mass index should be between 23-30. Your Body mass index is 28.15 kg/m. If this is out of the aforementioned range listed, please consider follow up with your Primary Care Provider.  If you are age 43 or younger, your body mass index should be between 19-25. Your Body mass index is 28.15 kg/m. If this is out of the aformentioned range listed, please consider follow up with your Primary Care Provider.   We have sent the following medications to your pharmacy for you to pick up at your convenience: VSL #3  Call in a few weeks with an update.  Thank you for choosing me and [facility].   Willette ClusterPaula Guenther, NP

## 2017-03-12 NOTE — Progress Notes (Signed)
     Chief Complaint:  Follow up  HPI: Patient is a 41 yo female who I saw in late October with several GI issues. Here for follow as requested.   Chronic bloating : As suggested, she has been avoiding many of the foods on the gas / bloating diet I provided her. Also, started  VSL#3, taking 2 BID and the bloating is much better. She has always had a difficult time releasing gas, now also better with the VSL#3.    Chronic constipation:  She did stop Linzess and started Amitiza. Not having "blow outs" like when on Linzess. She is having a small formed BM almost every day now and is overall pleased but hopes for a little more stool volume in the future .   Epigastric pain and nausea:  As far as the epigastric pain it has resolved with PPI and discontinuation of NSAIDS. She was able to stop the Carafate.  She does continues to have frequent episodes of nausea and fullness in epigastrium, worse with meals. No associated weight loss. Sometimes it  takes her an hour to get through a meal.   Past Medical History:  Diagnosis Date  . Asthma   . Depression   . Frequent headaches   . Heart murmur   . History of chicken pox   . 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 Shingles    3 at onset  . Syncope   . TIA (transient ischemic attack) 2013    Patient's surgical history, family medical history, social history, medications and allergies were all reviewed in Epic    Physical Exam: BP 100/60   Pulse 66   Ht 5\' 6"  (1.676 m)   Wt 174 lb 6.4 oz (79.1 kg)   SpO2 98%   BMI 28.15 kg/m   GENERAL:  Well developed white in NAD PSYCH: :Pleasant, cooperative, normal affect EENT:  conjunctiva pink, mucous membranes moist, neck supple without masses ABDOMEN:  Nondistended, soft, nontender. No obvious masses, no hepatomegaly,  normal bowel sounds SKIN:  turgor, no lesions seen Musculoskeletal:  Normal muscle tone, normal strength NEURO: Alert and oriented x 3, no focal neurologic  deficits    ASSESSMENT and PLAN:  Pleasant 41 year old female here for follow up on bloating, epigastric pain, nausea, and early satiety and chronic constipation.  -Bloating resolved with dietary modifications and VSL #3. She can try to incorporate one food at a time back into diet since she has omitted many things she loves to eat. Continue VSL # 3 -epigastric pain resolved with PPI and omission of NSAIDS. No longer requiring Carafate.Continue daily PPI for now.  -lingering nausea / early satiety without weight loss. Continue daily PPI. We agreed to give this a little more time since all of her other GI sx have improved, labs unremarkable and she hasn't had any abnormal weight loss. She will call in a couple of weeks with update. If persistent sx will arrange for an EGD.  - constipation improving on Amitiza. She is pleased about having a daily BM but volume still a little small. Continue current dose. If wants to increase with next refill she will let us now.   I spent 25 minutes of face-to-face time with the patient. Greater than 50% of the time was spent counseling and coordinating care. Questions answered  Willette ClusterPaula Harjit Leider , NP 03/12/2017, 9:02 AM

## 2017-03-13 ENCOUNTER — Encounter: Payer: Self-pay | Admitting: Nurse Practitioner

## 2017-03-13 NOTE — Progress Notes (Signed)
I agree with the above note, plan 

## 2017-03-21 ENCOUNTER — Telehealth: Payer: Self-pay | Admitting: Nurse Practitioner

## 2017-03-25 NOTE — Telephone Encounter (Signed)
Patient calling back in regards of this states she is out of Amitiza and is asking if dosing can be increased and sent to her pharmacy. Best call back# [phone]

## 2017-03-26 ENCOUNTER — Other Ambulatory Visit: Payer: Self-pay

## 2017-03-26 MED ORDER — LUBIPROSTONE 24 MCG PO CAPS: 24.0000 ug | ORAL_CAPSULE | Freq: Two times a day (BID) | ORAL | 2 refills | Status: DC

## 2017-03-26 NOTE — Telephone Encounter (Signed)
Judy Walters, please increase to 24 mcg BID with food, # 60 plus 2 refills. Thanks

## 2017-04-10 ENCOUNTER — Other Ambulatory Visit: Payer: Self-pay | Admitting: Physician Assistant

## 2017-04-30 ENCOUNTER — Other Ambulatory Visit: Payer: Self-pay | Admitting: Physician Assistant

## 2017-04-30 DIAGNOSIS — F329 Major depressive disorder, single episode, unspecified: Secondary | ICD-10-CM

## 2017-04-30 DIAGNOSIS — F32A Depression, unspecified: Secondary | ICD-10-CM

## 2017-04-30 MED ORDER — ALPRAZOLAM 0.25 MG PO TABS: 0.2500 mg | ORAL_TABLET | Freq: Two times a day (BID) | ORAL | 1 refills | Status: DC | PRN

## 2017-04-30 NOTE — Telephone Encounter (Signed)
Request for Xanax last rx 11/20/16 #30 Last CSC: 09/26/16 Please advise

## 2017-04-30 NOTE — Telephone Encounter (Signed)
Request for controlled substance 

## 2017-04-30 NOTE — Telephone Encounter (Signed)
Copied from CRM [id number]. Topic: Quick Communication - Rx Refill/Question >> Apr 30, 2017  9:08 AM Landry MellowFoltz, Melissa J wrote: Has the patient contacted their pharmacy? No. Pick up    (Agent: If no, request that the patient contact the pharmacy for the refill.)   Preferred Pharmacy (with phone number or street name): pt needs refill on xanax please call [phone] when ready for pick up    Agent: Please be advised that RX refills may take up to 3 business days. We ask that you follow-up with your pharmacy.

## 2017-05-01 ENCOUNTER — Telehealth: Payer: Self-pay | Admitting: Physician Assistant

## 2017-05-01 NOTE — Telephone Encounter (Signed)
Paperwork received. Will update chart once completed and faxed in.

## 2017-05-01 NOTE — Telephone Encounter (Signed)
Pt dropped off FMLA forms for her to be able to take care of son. Placed in bin up front with charge sheet.

## 2017-05-02 NOTE — Telephone Encounter (Signed)
FMLA completed and faxed. No charge.

## 2017-05-08 ENCOUNTER — Ambulatory Visit (INDEPENDENT_AMBULATORY_CARE_PROVIDER_SITE_OTHER): Payer: BLUE CROSS/BLUE SHIELD | Admitting: Physician Assistant

## 2017-05-08 ENCOUNTER — Encounter: Payer: Self-pay | Admitting: Physician Assistant

## 2017-05-08 ENCOUNTER — Other Ambulatory Visit: Payer: Self-pay

## 2017-05-08 VITALS — BP 98/70 | HR 70 | Temp 98.0°F | Resp 14 | Ht 66.0 in | Wt 174.0 lb

## 2017-05-08 DIAGNOSIS — F411 Generalized anxiety disorder: Secondary | ICD-10-CM

## 2017-05-08 MED ORDER — VSL#3 PO CAPS: ORAL_CAPSULE | ORAL | 3 refills | Status: DC

## 2017-05-08 MED ORDER — SUVOREXANT 10 MG PO TABS: 10.0000 mg | ORAL_TABLET | Freq: Every day | ORAL | 0 refills | Status: DC

## 2017-05-08 MED ORDER — SERTRALINE HCL 100 MG PO TABS: 100.0000 mg | ORAL_TABLET | Freq: Every day | ORAL | 3 refills | Status: DC

## 2017-05-08 NOTE — Patient Instructions (Signed)
Please start the new dose of the Sertraline 100 mg daily. Try to use the Alprazolam sparingly. Keep active. Try the Belsomra using the voucher given to see how this works for sleep.  Call me and let me know how this is working.   We will follow-up in 4-6 weeks. Use handout given to schedule an appointment with one of our counselors.

## 2017-05-08 NOTE — Progress Notes (Signed)
Patient presents to clinic today to discuss anxiety. Is currently on a regimen of sertraline 50 mg and alprazolam 0.25 mg PRN. Notes the sertraline is helping with anxiety. Denies depressed mood or anhedonia. Has noted increased anxiety over the past couple of weeks due to increased family stressors. Is taking the Xanax 2-3 times daily presently. Denies SI/HI. Would like to re-visit counseling.   Past Medical History:  Diagnosis Date  . Asthma   . Depression   . Frequent headaches   . Heart murmur   . History of chicken pox   . 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 Shingles    3 at onset  . Syncope   . TIA (transient ischemic attack) 2013    Current Outpatient Medications on File Prior to Visit  Medication Sig Dispense Refill  . ALPRAZolam (XANAX) 0.25 MG tablet Take 1 tablet (0.25 mg total) by mouth 2 (two) times daily as needed. for anxiety 30 tablet 1  . cetirizine (ZYRTEC) 10 MG tablet Take 1 tablet (10 mg total) by mouth daily. 30 tablet 11  . fluticasone (FLONASE) 50 MCG/ACT nasal spray Place 2 sprays into both nostrils daily. 16 g 6  . lubiprostone (AMITIZA) 24 MCG capsule Take 1 capsule (24 mcg total) by mouth 2 (two) times daily with a meal. 60 capsule 2  . omeprazole (PRILOSEC) 40 MG capsule Take 1 capsule (40 mg total) by mouth daily. 30 capsule 3   No current facility-administered medications on file prior to visit.     Allergies  Allergen Reactions  . Tinidazole Hives and Rash  . Betadine [Povidone Iodine] Itching and Rash    Family History  Problem Relation Age of Onset  . Hypertension Mother        Living  . Breast cancer Mother   . Leukemia Mother   . Bipolar disorder Mother   . COPD Father   . Diabetes Father   . Heart disease Father 38       Deceased  . Congestive Heart Failure Father   . Diabetes Sister   . Colon cancer Maternal Grandfather   . Lung cancer Paternal Grandmother   . Diabetes Paternal Grandmother   . Pancreatic cancer  Paternal Uncle   . COPD Paternal Uncle   . Congestive Heart Failure Paternal Uncle   . Heart defect Paternal Uncle   . Stroke Paternal Uncle   . Breast cancer Maternal Aunt        x2  . Stroke Maternal Aunt   . Lung cancer Maternal Aunt   . Diabetes Sister   . Obesity Sister   . Diabetes Other        Paternal-side  . Heart murmur Son   . Healthy Daughter   . Asthma Son        #2  . Seizures Son        #2  . Behavior problems Son        #2    Social History   Socioeconomic History  . Marital status: Single    Spouse name: None  . Number of children: 5  . Years of education: None  . Highest education level: None  Social Needs  . Financial resource strain: None  . Food insecurity - worry: None  . Food insecurity - inability: None  . Transportation needs - medical: None  . Transportation needs - non-medical: None  Occupational History  . None  Tobacco Use  . Smoking  status: Current Every Day Smoker    Packs/day: 1.00    Years: 15.00    Pack years: 15.00    Types: Cigarettes  . Smokeless tobacco: Never Used  Substance and Sexual Activity  . Alcohol use: No    Alcohol/week: 0.0 oz  . Drug use: No  . Sexual activity: Yes    Birth control/protection: Surgical  Other Topics Concern  . None  Social History Narrative  . None   Review of Systems - See HPI.  All other ROS are negative.  BP 98/70   Pulse 70   Temp 98 F (36.7 C) (Oral)   Resp 14   Ht 5\' 6"  (1.676 m)   Wt 174 lb (78.9 kg)   SpO2 100%   BMI 28.08 kg/m   Physical Exam  Constitutional: She is oriented to person, place, and time and well-developed, well-nourished, and in no distress.  HENT:  Head: Normocephalic and atraumatic.  Eyes: Conjunctivae are normal.  Neck: Neck supple.  Cardiovascular: Normal rate, regular rhythm, normal heart sounds and intact distal pulses.  Pulmonary/Chest: Effort normal and breath sounds normal. No respiratory distress. She has no wheezes. She has no rales. She  exhibits no tenderness.  Neurological: She is alert and oriented to person, place, and time.  Skin: Skin is warm and dry. No rash noted.  Psychiatric: Affect normal.  Vitals reviewed.  Recent Results (from the past 2160 hour(s))  [facility] MAMMOGRAPHY     Status: None   Collection Time: 02/22/17 12:00 AM  Result Value Ref Range   HM Mammogram 0-4 Bi-Rad 0-4 Bi-Rad, Self Reported Normal    Comment: Bi-rads 1 - negative    Assessment/Plan: Anxiety state Increase Sertraline to 100 mg daily. Continue Alprazolam, trying to use sparingly. Handout given on counseling services so patient can schedule appointment.  Follow-up scheduled.    Piedad ClimesWilliam Cody Skyley Grandmaison, PA-C

## 2017-05-08 NOTE — Assessment & Plan Note (Signed)
Increase Sertraline to 100 mg daily. Continue Alprazolam, trying to use sparingly. Handout given on counseling services so patient can schedule appointment.  Follow-up scheduled.

## 2017-05-09 ENCOUNTER — Telehealth: Payer: Self-pay | Admitting: Emergency Medicine

## 2017-05-09 NOTE — Telephone Encounter (Signed)
[facility] to [facility] quanity 10 for the Belsomra. They will not break the pack due to medication is a controlled substance.They wanted PCP to write for #30. Verified with pharmacy about quanity. They do not have Belsomra 10 mg in stock.  They can find a pharmacy that have Belsomra 10 mg in stock.

## 2017-06-21 ENCOUNTER — Ambulatory Visit (INDEPENDENT_AMBULATORY_CARE_PROVIDER_SITE_OTHER): Payer: BLUE CROSS/BLUE SHIELD | Admitting: Psychology

## 2017-06-21 DIAGNOSIS — F900 Attention-deficit hyperactivity disorder, predominantly inattentive type: Secondary | ICD-10-CM

## 2017-06-21 DIAGNOSIS — F411 Generalized anxiety disorder: Secondary | ICD-10-CM | POA: Diagnosis not present

## 2017-06-21 DIAGNOSIS — F331 Major depressive disorder, recurrent, moderate: Secondary | ICD-10-CM | POA: Diagnosis not present

## 2017-07-05 ENCOUNTER — Ambulatory Visit: Payer: BLUE CROSS/BLUE SHIELD | Admitting: Psychology

## 2017-07-15 ENCOUNTER — Ambulatory Visit (INDEPENDENT_AMBULATORY_CARE_PROVIDER_SITE_OTHER): Payer: BLUE CROSS/BLUE SHIELD | Admitting: Psychology

## 2017-07-15 DIAGNOSIS — F900 Attention-deficit hyperactivity disorder, predominantly inattentive type: Secondary | ICD-10-CM

## 2017-07-15 DIAGNOSIS — F331 Major depressive disorder, recurrent, moderate: Secondary | ICD-10-CM

## 2017-07-15 DIAGNOSIS — F411 Generalized anxiety disorder: Secondary | ICD-10-CM | POA: Diagnosis not present

## 2017-07-31 ENCOUNTER — Ambulatory Visit (INDEPENDENT_AMBULATORY_CARE_PROVIDER_SITE_OTHER): Payer: BLUE CROSS/BLUE SHIELD | Admitting: Physician Assistant

## 2017-07-31 ENCOUNTER — Encounter: Payer: Self-pay | Admitting: Physician Assistant

## 2017-07-31 ENCOUNTER — Other Ambulatory Visit: Payer: Self-pay

## 2017-07-31 VITALS — BP 120/74 | HR 74 | Temp 98.3°F | Resp 16 | Ht 66.0 in | Wt 168.4 lb

## 2017-07-31 DIAGNOSIS — F32A Depression, unspecified: Secondary | ICD-10-CM

## 2017-07-31 DIAGNOSIS — F329 Major depressive disorder, single episode, unspecified: Secondary | ICD-10-CM

## 2017-07-31 DIAGNOSIS — F900 Attention-deficit hyperactivity disorder, predominantly inattentive type: Secondary | ICD-10-CM | POA: Diagnosis not present

## 2017-07-31 DIAGNOSIS — F411 Generalized anxiety disorder: Secondary | ICD-10-CM | POA: Diagnosis not present

## 2017-07-31 MED ORDER — AMPHETAMINE-DEXTROAMPHET ER 20 MG PO CP24: 20.0000 mg | ORAL_CAPSULE | ORAL | 0 refills | Status: DC

## 2017-07-31 MED ORDER — SERTRALINE HCL 100 MG PO TABS: 100.0000 mg | ORAL_TABLET | Freq: Every day | ORAL | 3 refills | Status: DC

## 2017-07-31 NOTE — Patient Instructions (Signed)
Please restart your Sertraline, taking 1/2 tablet daily for 1 week before increasing to 1 tablet daily (100 mg).  Start the Adderall XR as directed. Keep your follow-up appointments with Dr. Forbes CellarAllison Bray.  Please follow-up with me in 2 weeks at schedule appointment as directed.  If you note any thoughts or plans of self harm, please call 911 or use the handout I have give for crisis lines.

## 2017-07-31 NOTE — Progress Notes (Signed)
Patient presents to clinic today after recent ADHD evaluation by Dr. Forbes Cellar, to discuss treatment/management options.  On review of evaluation (hard copy received via fax from Dr. Jason Fila), she was given the following DSM V diagnoses: major depressive disorder, GAD and ADHD predominantly inattentive. Patient would like to discuss starting medications today. Would prefer something extended release due to hectic schedule and ease of dosing. Patient also notes she has been dealing quite a bit with worsening mood. Has had numerous stressors and triggers for anxiety over the past year including having to take care of her eldest son who was involved in a motor vehicle accident and requiring multiple surgeries. Was recently told that the house she rents is being sold so she has 30 days to vacate which is causing immense stress. Had stopped her sertraline several weeks back. Has noted depressed mood with anhedonia. Denies true SI/HI but states she sometimes wishes she did not have to deal with "all of this". Denies this thoughts presently.   Past Medical History:  Diagnosis Date  . Asthma   . Depression   . Frequent headaches   . Heart murmur   . History of chicken pox   . 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 Shingles    3 at onset  . Syncope   . TIA (transient ischemic attack) 2013    Current Outpatient Medications on File Prior to Visit  Medication Sig Dispense Refill  . ALPRAZolam (XANAX) 0.25 MG tablet Take 1 tablet (0.25 mg total) by mouth 2 (two) times daily as needed. for anxiety 30 tablet 1  . lubiprostone (AMITIZA) 24 MCG capsule Take 1 capsule (24 mcg total) by mouth 2 (two) times daily with a meal. 60 capsule 2  . Probiotic Product (VSL#3) CAPS Take 112 units capsules - take two capsule twice daily. 120 capsule 3  . Suvorexant (BELSOMRA) 10 MG TABS Take 10 mg by mouth at bedtime. 10 tablet 0   No current facility-administered medications on file prior to visit.      Allergies  Allergen Reactions  . Tinidazole Hives and Rash  . Betadine [Povidone Iodine] Itching and Rash    Family History  Problem Relation Age of Onset  . Hypertension Mother        Living  . Breast cancer Mother   . Leukemia Mother   . Bipolar disorder Mother   . COPD Father   . Diabetes Father   . Heart disease Father 12       Deceased  . Congestive Heart Failure Father   . Diabetes Sister   . Colon cancer Maternal Grandfather   . Lung cancer Paternal Grandmother   . Diabetes Paternal Grandmother   . Pancreatic cancer Paternal Uncle   . COPD Paternal Uncle   . Congestive Heart Failure Paternal Uncle   . Heart defect Paternal Uncle   . Stroke Paternal Uncle   . Breast cancer Maternal Aunt        x2  . Stroke Maternal Aunt   . Lung cancer Maternal Aunt   . Diabetes Sister   . Obesity Sister   . Diabetes Other        Paternal-side  . Heart murmur Son   . Healthy Daughter   . Asthma Son        #2  . Seizures Son        #2  . Behavior problems Son        #2  Social History   Socioeconomic History  . Marital status: Single    Spouse name: Not on file  . Number of children: 5  . Years of education: Not on file  . Highest education level: Not on file  Occupational History  . Not on file  Social Needs  . Financial resource strain: Not on file  . Food insecurity:    Worry: Not on file    Inability: Not on file  . Transportation needs:    Medical: Not on file    Non-medical: Not on file  Tobacco Use  . Smoking status: Current Every Day Smoker    Packs/day: 1.00    Years: 15.00    Pack years: 15.00    Types: Cigarettes  . Smokeless tobacco: Never Used  Substance and Sexual Activity  . Alcohol use: No    Alcohol/week: 0.0 oz  . Drug use: No  . Sexual activity: Yes    Birth control/protection: Surgical  Lifestyle  . Physical activity:    Days per week: Not on file    Minutes per session: Not on file  . Stress: Not on file  Relationships   . Social connections:    Talks on phone: Not on file    Gets together: Not on file    Attends religious service: Not on file    Active member of club or organization: Not on file    Attends meetings of clubs or organizations: Not on file    Relationship status: Not on file  Other Topics Concern  . Not on file  Social History Narrative  . Not on file   Review of Systems - See HPI.  All other ROS are negative.  BP 120/74   Pulse 74   Temp 98.3 F (36.8 C)   Resp 16   Ht 5\' 6"  (1.676 m)   Wt 168 lb 6.4 oz (76.4 kg)   SpO2 98%   BMI 27.18 kg/m   Physical Exam  Constitutional: She appears well-developed and well-nourished.  HENT:  Head: Normocephalic and atraumatic.  Eyes: Conjunctivae are normal.  Pulmonary/Chest: Effort normal.  Skin: Skin is warm and dry.  Psychiatric: Her speech is normal and behavior is normal. Thought content normal. Her mood appears anxious. She exhibits a depressed mood.   Assessment/Plan: Anxiety state Anxiety is doing well at the moment. Is mainly affected by inattention which we are treating. Will monitor.   Depressive disorder Deteriorated due to recent stressors. Had stopped her sertraline. Will restart at 25 mg for a few days to help her adjust. Then increase to 50 mg. After two weeks if tolerating well will increase to 100 mg daily. Has follow-up next week with Dr. Jason FilaBray. Close follow-up schedule. Handout given for crisis line. ER for any SI/HI.   Attention deficit hyperactivity disorder (ADHD), predominantly inattentive type Will start low dose of Adderall XR. Follow-up with PsyD as scheduled for CBT. Follow-up with me scheduled.     Piedad ClimesWilliam Cody Tyreesha Maharaj, PA-C

## 2017-08-05 ENCOUNTER — Other Ambulatory Visit: Payer: Self-pay | Admitting: Family Medicine

## 2017-08-05 DIAGNOSIS — F411 Generalized anxiety disorder: Secondary | ICD-10-CM

## 2017-08-05 DIAGNOSIS — F900 Attention-deficit hyperactivity disorder, predominantly inattentive type: Secondary | ICD-10-CM | POA: Insufficient documentation

## 2017-08-05 NOTE — Assessment & Plan Note (Signed)
Will start low dose of Adderall XR. Follow-up with PsyD as scheduled for CBT. Follow-up with me scheduled.

## 2017-08-05 NOTE — Telephone Encounter (Signed)
Last OV: 07/31/17 Fill Date: 05/03/2017

## 2017-08-05 NOTE — Assessment & Plan Note (Signed)
Deteriorated due to recent stressors. Had stopped her sertraline. Will restart at 25 mg for a few days to help her adjust. Then increase to 50 mg. After two weeks if tolerating well will increase to 100 mg daily. Has follow-up next week with Dr. Jason FilaBray. Close follow-up schedule. Handout given for crisis line. ER for any SI/HI.

## 2017-08-05 NOTE — Assessment & Plan Note (Signed)
Anxiety is doing well at the moment. Is mainly affected by inattention which we are treating. Will monitor.

## 2017-08-19 ENCOUNTER — Other Ambulatory Visit: Payer: Self-pay

## 2017-08-19 ENCOUNTER — Ambulatory Visit (INDEPENDENT_AMBULATORY_CARE_PROVIDER_SITE_OTHER): Payer: BLUE CROSS/BLUE SHIELD | Admitting: Physician Assistant

## 2017-08-19 ENCOUNTER — Encounter: Payer: Self-pay | Admitting: Physician Assistant

## 2017-08-19 VITALS — BP 102/70 | HR 76 | Temp 98.3°F | Resp 14 | Ht 66.0 in | Wt 159.0 lb

## 2017-08-19 DIAGNOSIS — F32A Depression, unspecified: Secondary | ICD-10-CM

## 2017-08-19 DIAGNOSIS — F900 Attention-deficit hyperactivity disorder, predominantly inattentive type: Secondary | ICD-10-CM

## 2017-08-19 DIAGNOSIS — F411 Generalized anxiety disorder: Secondary | ICD-10-CM | POA: Diagnosis not present

## 2017-08-19 DIAGNOSIS — Z0000 Encounter for general adult medical examination without abnormal findings: Secondary | ICD-10-CM | POA: Diagnosis not present

## 2017-08-19 DIAGNOSIS — F329 Major depressive disorder, single episode, unspecified: Secondary | ICD-10-CM | POA: Diagnosis not present

## 2017-08-19 LAB — COMPREHENSIVE METABOLIC PANEL
ALBUMIN: 4.4 g/dL (ref 3.5–5.2)
ALK PHOS: 67 U/L (ref 39–117)
ALT: 7 U/L (ref 0–35)
AST: 12 U/L (ref 0–37)
BILIRUBIN TOTAL: 0.4 mg/dL (ref 0.2–1.2)
BUN: 8 mg/dL (ref 6–23)
CALCIUM: 9.4 mg/dL (ref 8.4–10.5)
CO2: 25 mEq/L (ref 19–32)
Chloride: 103 mEq/L (ref 96–112)
Creatinine, Ser: 0.55 mg/dL (ref 0.40–1.20)
GFR: 127.71 mL/min (ref >60.00)
Glucose, Bld: 72 mg/dL (ref 70–99)
POTASSIUM: 3.9 meq/L (ref 3.5–5.1)
Sodium: 139 mEq/L (ref 135–145)
TOTAL PROTEIN: 6.8 g/dL (ref 6.0–8.3)

## 2017-08-19 LAB — CBC WITH DIFFERENTIAL/PLATELET
BASOS ABS: 0 10*3/uL (ref 0.0–0.1)
Basophils Relative: 0.6 % (ref 0.0–3.0)
EOS PCT: 1.9 % (ref 0.0–5.0)
Eosinophils Absolute: 0.1 10*3/uL (ref 0.0–0.7)
HEMATOCRIT: 42 % (ref 36.0–46.0)
Hemoglobin: 14 g/dL (ref 12.0–15.0)
LYMPHS ABS: 1.8 10*3/uL (ref 0.7–4.0)
LYMPHS PCT: 28.8 % (ref 12.0–46.0)
MCHC: 33.3 g/dL (ref 30.0–36.0)
MCV: 85.8 fl (ref 78.0–100.0)
MONOS PCT: 9.9 % (ref 3.0–12.0)
Monocytes Absolute: 0.6 10*3/uL (ref 0.1–1.0)
NEUTROS ABS: 3.7 10*3/uL (ref 1.4–7.7)
Neutrophils Relative %: 58.8 % (ref 43.0–77.0)
Platelets: 206 10*3/uL (ref 150.0–400.0)
RBC: 4.89 Mil/uL (ref 3.87–5.11)
RDW: 14.1 % (ref 11.5–15.5)
WBC: 6.4 10*3/uL (ref 4.0–10.5)

## 2017-08-19 LAB — LIPID PANEL
CHOLESTEROL: 132 mg/dL (ref 0–200)
HDL: 50.3 mg/dL (ref >39.00)
LDL Cholesterol: 67 mg/dL (ref 0–99)
NonHDL: 81.24
TRIGLYCERIDES: 72 mg/dL (ref 0.0–149.0)
Total CHOL/HDL Ratio: 3
VLDL: 14.4 mg/dL (ref 0.0–40.0)

## 2017-08-19 LAB — TSH: TSH: 3.46 u[IU]/mL (ref 0.35–4.50)

## 2017-08-19 MED ORDER — AMPHETAMINE-DEXTROAMPHETAMINE 10 MG PO TABS: 10.0000 mg | ORAL_TABLET | Freq: Every day | ORAL | 0 refills | Status: DC

## 2017-08-19 NOTE — Assessment & Plan Note (Signed)
Medications refilled. Continue current regimen. Discussed mindfulness meditation to help with current issues. Exacerbation is situational. No alarm symptoms. Will follow.

## 2017-08-19 NOTE — Assessment & Plan Note (Signed)
Depression screen negative. Health Maintenance reviewed. Preventive schedule discussed and handout given in AVS. Will obtain fasting labs today.  

## 2017-08-19 NOTE — Assessment & Plan Note (Signed)
Medications refilled. Continue current regimen. Discussed mindfulness meditation to help with current issues. Exacerbation is situational. No alarm symptoms. Will follow.  

## 2017-08-19 NOTE — Assessment & Plan Note (Signed)
Notes improvement. Not lasting throughout entire workday. Will continue 20 mg XR Adderall. Will add on 10 mg IR Adderall to use around 1 PM daily. Follow-up discussed.

## 2017-08-19 NOTE — Patient Instructions (Signed)
Please go to the lab for blood work.   Our office will call you with your results unless you have chosen to receive results via MyChart.  If your blood work is normal we will follow-up each year for physicals and as scheduled for chronic medical problems.  If anything is abnormal we will treat accordingly and get you in for a follow-up.  Please continue the long-acting Adderall, adding on the short acting around 1PM.  Call me in a week to let me know how this is working.    Preventive Care 40-64 Years, Female Preventive care refers to lifestyle choices and visits with your health care provider that can promote health and wellness. What does preventive care include?  A yearly physical exam. This is also called an annual well check.  Dental exams once or twice a year.  Routine eye exams. Ask your health care provider how often you should have your eyes checked.  Personal lifestyle choices, including: ? Daily care of your teeth and gums. ? Regular physical activity. ? Eating a healthy diet. ? Avoiding tobacco and drug use. ? Limiting alcohol use. ? Practicing safe sex. ? Taking low-dose aspirin daily starting at age 39. ? Taking vitamin and mineral supplements as recommended by your health care provider. What happens during an annual well check? The services and screenings done by your health care provider during your annual well check will depend on your age, overall health, lifestyle risk factors, and family history of disease. Counseling Your health care provider may ask you questions about your:  Alcohol use.  Tobacco use.  Drug use.  Emotional well-being.  Home and relationship well-being.  Sexual activity.  Eating habits.  Work and work Statistician.  Method of birth control.  Menstrual cycle.  Pregnancy history.  Screening You may have the following tests or measurements:  Height, weight, and BMI.  Blood pressure.  Lipid and cholesterol levels.  These may be checked every 5 years, or more frequently if you are over 64 years old.  Skin check.  Lung cancer screening. You may have this screening every year starting at age 73 if you have a 30-pack-year history of smoking and currently smoke or have quit within the past 15 years.  Fecal occult blood test (FOBT) of the stool. You may have this test every year starting at age 53.  Flexible sigmoidoscopy or colonoscopy. You may have a sigmoidoscopy every 5 years or a colonoscopy every 10 years starting at age 62.  Hepatitis C blood test.  Hepatitis B blood test.  Sexually transmitted disease (STD) testing.  Diabetes screening. This is done by checking your blood sugar (glucose) after you have not eaten for a while (fasting). You may have this done every 1-3 years.  Mammogram. This may be done every 1-2 years. Talk to your health care provider about when you should start having regular mammograms. This may depend on whether you have a family history of breast cancer.  BRCA-related cancer screening. This may be done if you have a family history of breast, ovarian, tubal, or peritoneal cancers.  Pelvic exam and Pap test. This may be done every 3 years starting at age 59. Starting at age 105, this may be done every 5 years if you have a Pap test in combination with an HPV test.  Bone density scan. This is done to screen for osteoporosis. You may have this scan if you are at high risk for osteoporosis.  Discuss your test results, treatment options,  and if necessary, the need for more tests with your health care provider. Vaccines Your health care provider may recommend certain vaccines, such as:  Influenza vaccine. This is recommended every year.  Tetanus, diphtheria, and acellular pertussis (Tdap, Td) vaccine. You may need a Td booster every 10 years.  Varicella vaccine. You may need this if you have not been vaccinated.  Zoster vaccine. You may need this after age 33.  Measles,  mumps, and rubella (MMR) vaccine. You may need at least one dose of MMR if you were born in 1957 or later. You may also need a second dose.  Pneumococcal 13-valent conjugate (PCV13) vaccine. You may need this if you have certain conditions and were not previously vaccinated.  Pneumococcal polysaccharide (PPSV23) vaccine. You may need one or two doses if you smoke cigarettes or if you have certain conditions.  Meningococcal vaccine. You may need this if you have certain conditions.  Hepatitis A vaccine. You may need this if you have certain conditions or if you travel or work in places where you may be exposed to hepatitis A.  Hepatitis B vaccine. You may need this if you have certain conditions or if you travel or work in places where you may be exposed to hepatitis B.  Haemophilus influenzae type b (Hib) vaccine. You may need this if you have certain conditions.  Talk to your health care provider about which screenings and vaccines you need and how often you need them. This information is not intended to replace advice given to you by your health care provider. Make sure you discuss any questions you have with your health care provider. Document Released: 05/06/2015 Document Revised: 12/28/2015 Document Reviewed: 02/08/2015 Elsevier Interactive Patient Education  [employer].

## 2017-08-19 NOTE — Progress Notes (Signed)
Patient presents to clinic today for annual exam.  Patient is fasting for labs.  Diet -- Well-balanced overall. Exercise -- none currently. Is very busy handling personal affairs.  Chronic Issues: Anxiety/Depression -- Is taking Sertraline as directed. Tolerating well. There has been some exacerbation of anxiety recently 2/2 acute stressors. Is having to find a new home. Denies SI/HI. Needs refill of Alprazolam.   ADHD -- Notes big improvement with the Adderall. Is taking as directed. Appetite has remained stable. She notes the medication seems to be wearing off around 1-2 pm daily.   Insomnia -- long-standing history. Exacerbated with anxiety. Notes doing very well on the Belsomra.   Health Maintenance: Immunizations -- Tetanus up-to-date. Mammogram -- up-to-date PAP -- up-to-date per patient.   Past Medical History:  Diagnosis Date  . Asthma   . Depression   . Frequent headaches   . Heart murmur   . History of chicken pox   . 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 Shingles    3 at onset  . Syncope   . TIA (transient ischemic attack) 2013    Past Surgical History:  Procedure Laterality Date  . APPENDECTOMY  1989  . carpel tunnel  2012  . CESAREAN SECTION  2012  . TONSILLECTOMY  1985  . TUBAL LIGATION    . WISDOM TOOTH EXTRACTION      Current Outpatient Medications on File Prior to Visit  Medication Sig Dispense Refill  . amphetamine-dextroamphetamine (ADDERALL XR) 20 MG 24 hr capsule Take 1 capsule (20 mg total) by mouth every morning. 30 capsule 0  . sertraline (ZOLOFT) 100 MG tablet Take 1 tablet (100 mg total) by mouth daily. 30 tablet 3  . ALPRAZolam (XANAX) 0.25 MG tablet Take 1 tablet (0.25 mg total) by mouth 2 (two) times daily as needed. for anxiety (Patient not taking: Reported on 08/19/2017) 30 tablet 1  . BELSOMRA 10 MG TABS TAKE 1 TABLET BY MOUTH AT BEDTIME (Patient not taking: Reported on 08/19/2017) 30 tablet 0  . Probiotic Product (VSL#3)  CAPS Take 112 units capsules - take two capsule twice daily. (Patient not taking: Reported on 08/19/2017) 120 capsule 3   No current facility-administered medications on file prior to visit.     Allergies  Allergen Reactions  . Tinidazole Hives and Rash  . Betadine [Povidone Iodine] Itching and Rash    Family History  Problem Relation Age of Onset  . Hypertension Mother        Living  . Breast cancer Mother   . Leukemia Mother   . Bipolar disorder Mother   . COPD Father   . Diabetes Father   . Heart disease Father 62       Deceased  . Congestive Heart Failure Father   . Diabetes Sister   . Colon cancer Maternal Grandfather   . Lung cancer Paternal Grandmother   . Diabetes Paternal Grandmother   . Pancreatic cancer Paternal Uncle   . COPD Paternal Uncle   . Congestive Heart Failure Paternal Uncle   . Heart defect Paternal Uncle   . Stroke Paternal Uncle   . Breast cancer Maternal Aunt        x2  . Stroke Maternal Aunt   . Lung cancer Maternal Aunt   . Diabetes Sister   . Obesity Sister   . Diabetes Other        Paternal-side  . Heart murmur Son   . Healthy Daughter   .  Asthma Son        #2  . Seizures Son        #2  . Behavior problems Son        #2    Social History   Socioeconomic History  . Marital status: Single    Spouse name: Not on file  . Number of children: 5  . Years of education: Not on file  . Highest education level: Not on file  Occupational History  . Not on file  Social Needs  . Financial resource strain: Not on file  . Food insecurity:    Worry: Not on file    Inability: Not on file  . Transportation needs:    Medical: Not on file    Non-medical: Not on file  Tobacco Use  . Smoking status: Current Every Day Smoker    Packs/day: 1.00    Years: 15.00    Pack years: 15.00    Types: Cigarettes  . Smokeless tobacco: Never Used  Substance and Sexual Activity  . Alcohol use: No    Alcohol/week: 0.0 oz  . Drug use: No  . Sexual  activity: Yes    Birth control/protection: Surgical  Lifestyle  . Physical activity:    Days per week: Not on file    Minutes per session: Not on file  . Stress: Not on file  Relationships  . Social connections:    Talks on phone: Not on file    Gets together: Not on file    Attends religious service: Not on file    Active member of club or organization: Not on file    Attends meetings of clubs or organizations: Not on file    Relationship status: Not on file  . Intimate partner violence:    Fear of current or ex partner: Not on file    Emotionally abused: Not on file    Physically abused: Not on file    Forced sexual activity: Not on file  Other Topics Concern  . Not on file  Social History Narrative  . Not on file   Review of Systems  Constitutional: Negative for fever and weight loss.  HENT: Negative for ear discharge, ear pain, hearing loss and tinnitus.   Eyes: Negative for blurred vision, double vision, photophobia and pain.  Respiratory: Negative for cough and shortness of breath.   Cardiovascular: Negative for chest pain and palpitations.  Gastrointestinal: Negative for abdominal pain, blood in stool, constipation, diarrhea, heartburn, melena, nausea and vomiting.  Genitourinary: Negative for dysuria, flank pain, frequency, hematuria and urgency.  Musculoskeletal: Negative for falls.  Neurological: Negative for dizziness, loss of consciousness and headaches.  Endo/Heme/Allergies: Negative for environmental allergies.  Psychiatric/Behavioral: Negative for depression, hallucinations, substance abuse and suicidal ideas. The patient is not nervous/anxious and does not have insomnia.    BP 102/70   Pulse 76   Temp 98.3 F (36.8 C) (Oral)   Resp 14   Ht  (1.676 m)   Wt 159 lb (72.1 kg)   SpO2 99%   BMI 25.66 kg/m   Physical Exam  Constitutional: She is oriented to person, place, and time.  HENT:  Head: Normocephalic and atraumatic.  Right Ear: Tympanic  membrane, external ear and ear canal normal.  Left Ear: Tympanic membrane, external ear and ear canal normal.  Nose: Nose normal. No mucosal edema.  Mouth/Throat: Uvula is midline, oropharynx is clear and moist and mucous membranes are normal. No oropharyngeal exudate or posterior oropharyngeal erythema.  Eyes: Pupils are equal, round, and reactive to light. Conjunctivae are normal.  Neck: Neck supple. No thyromegaly present.  Cardiovascular: Normal rate, regular rhythm, normal heart sounds and intact distal pulses.  Pulmonary/Chest: Effort normal and breath sounds normal. No respiratory distress. She has no wheezes. She has no rales.  Abdominal: Soft. Bowel sounds are normal. She exhibits no distension and no mass. There is no tenderness. There is no rebound and no guarding.  Lymphadenopathy:    She has no cervical adenopathy.  Neurological: She is alert and oriented to person, place, and time. No cranial nerve deficit.  Skin: Skin is warm and dry. No rash noted.  Vitals reviewed.  Assessment/Plan: Anxiety state Medications refilled. Continue current regimen. Discussed mindfulness meditation to help with current issues. Exacerbation is situational. No alarm symptoms. Will follow.   Attention deficit hyperactivity disorder (ADHD), predominantly inattentive type Notes improvement. Not lasting throughout entire workday. Will continue 20 mg XR Adderall. Will add on 10 mg IR Adderall to use around 1 PM daily. Follow-up discussed.   Depressive disorder Medications refilled. Continue current regimen. Discussed mindfulness meditation to help with current issues. Exacerbation is situational. No alarm symptoms. Will follow.    Visit for preventive health examination Depression screen negative. Health Maintenance reviewed. Preventive schedule discussed and handout given in AVS. Will obtain fasting labs today.     Piedad Climes, PA-C

## 2017-08-20 ENCOUNTER — Other Ambulatory Visit: Payer: Self-pay | Admitting: Physician Assistant

## 2017-08-20 DIAGNOSIS — F32A Depression, unspecified: Secondary | ICD-10-CM

## 2017-08-20 DIAGNOSIS — F329 Major depressive disorder, single episode, unspecified: Secondary | ICD-10-CM

## 2017-08-21 NOTE — Telephone Encounter (Signed)
Last refill on 04/30/17 #30 1 RF Last OV 08/19/17 CPE Please advise of refill

## 2017-08-23 ENCOUNTER — Ambulatory Visit (INDEPENDENT_AMBULATORY_CARE_PROVIDER_SITE_OTHER): Payer: BLUE CROSS/BLUE SHIELD | Admitting: Psychology

## 2017-08-23 DIAGNOSIS — F331 Major depressive disorder, recurrent, moderate: Secondary | ICD-10-CM

## 2017-08-23 DIAGNOSIS — F411 Generalized anxiety disorder: Secondary | ICD-10-CM | POA: Diagnosis not present

## 2017-08-23 DIAGNOSIS — F902 Attention-deficit hyperactivity disorder, combined type: Secondary | ICD-10-CM | POA: Diagnosis not present

## 2017-08-27 ENCOUNTER — Other Ambulatory Visit: Payer: Self-pay | Admitting: Physician Assistant

## 2017-08-27 NOTE — Telephone Encounter (Signed)
Copied from CRM [id number]. Topic: Quick Communication - Rx Refill/Question >> Aug 27, 2017 10:05 AM Arlyss Gandy, NT wrote: Medication: amphetamine-dextroamphetamine (ADDERALL XR) pt requesting instead of  to have a  capsule.  Has the patient contacted their pharmacy? Yes.   (Agent: If no, request that the patient contact the pharmacy for the refill.) Preferred Pharmacy (with phone number or street name): [facility] - [place], [place] - [address] [phone] (Phone) [phone] (Fax)     Agent: Please be advised that RX refills may take up to 3 business days. We ask that you follow-up with your pharmacy.

## 2017-08-28 MED ORDER — AMPHETAMINE-DEXTROAMPHET ER 30 MG PO CP24: 30.0000 mg | ORAL_CAPSULE | ORAL | 0 refills | Status: DC

## 2017-08-28 NOTE — Telephone Encounter (Signed)
Left a detailed message on VM letting patient know rx for Adderall XR 30 mg sent to the pharmacy and continue short acting Adderall 10 mg as needed. If symptoms don't improve can consider switching to Vyvanse.

## 2017-08-28 NOTE — Telephone Encounter (Signed)
I am ok attempting a trial of 30 mg XR each morning with the short-acting 10 mg later in the day if needed. If we are still noting an issue we may want to increase the short acting dose or consider an even longer-acting agent like Vyvanse.   I have sent in a prescription of the 30 mg adderall XR.

## 2017-08-28 NOTE — Telephone Encounter (Signed)
Spoke with patient and she states that the Adderall XR 20 is still wearing off around 1p. She takes the Adderall 10 mg in late afternoon but helps a little.  Discussion of increasing the 20 mg  At last visit. She is due for a refill as well.  Please advise

## 2017-09-02 ENCOUNTER — Ambulatory Visit: Payer: BLUE CROSS/BLUE SHIELD | Admitting: Psychology

## 2017-09-06 ENCOUNTER — Ambulatory Visit (INDEPENDENT_AMBULATORY_CARE_PROVIDER_SITE_OTHER): Payer: BLUE CROSS/BLUE SHIELD | Admitting: Psychology

## 2017-09-06 DIAGNOSIS — F902 Attention-deficit hyperactivity disorder, combined type: Secondary | ICD-10-CM | POA: Diagnosis not present

## 2017-09-06 DIAGNOSIS — F411 Generalized anxiety disorder: Secondary | ICD-10-CM | POA: Diagnosis not present

## 2017-09-06 DIAGNOSIS — F331 Major depressive disorder, recurrent, moderate: Secondary | ICD-10-CM

## 2017-09-20 ENCOUNTER — Ambulatory Visit: Payer: Self-pay | Admitting: Psychology

## 2017-09-25 ENCOUNTER — Other Ambulatory Visit: Payer: Self-pay | Admitting: Physician Assistant

## 2017-09-25 ENCOUNTER — Other Ambulatory Visit: Payer: Self-pay | Admitting: Nurse Practitioner

## 2017-09-25 NOTE — Telephone Encounter (Signed)
Copied from CRM [id number]. Topic: Quick Communication - Rx Refill/Question >> Sep 25, 2017  2:41 PM Rica KoyanagiWeikart, Melissa J wrote: Medication: amphetamine-dextroamphetamine (ADDERALL XR) 30 MG 24 hr capsule amphetamine-dextroamphetamine (ADDERALL) 10 MG tablet ALPRAZolam (XANAX) 0.25 MG tablet     Has the patient contacted their pharmacy? No. (Agent: If no, request that the patient contact the pharmacy for the refill.) (Agent: If yes, when and what did the pharmacy advise?)  Preferred Pharmacy (with phone number or street name): [facility] [address]   Agent: Please be advised that RX refills may take up to 3 business days. We ask that you follow-up with your pharmacy.

## 2017-09-26 NOTE — Telephone Encounter (Signed)
Rx refill request: Adderall XR 30 mg     Last filled: 08/28/17  # 30                            Adderall  10 mg          Last filled: 08/19/17 #30   LOV: 08/19/17  PCP: Daphine DeutscherMartin  Pharmacy: verified

## 2017-09-27 MED ORDER — AMPHETAMINE-DEXTROAMPHETAMINE 10 MG PO TABS: 10.0000 mg | ORAL_TABLET | Freq: Every day | ORAL | 0 refills | Status: DC

## 2017-09-27 MED ORDER — AMPHETAMINE-DEXTROAMPHET ER 30 MG PO CP24: 30.0000 mg | ORAL_CAPSULE | ORAL | 0 refills | Status: DC

## 2017-10-10 ENCOUNTER — Ambulatory Visit (INDEPENDENT_AMBULATORY_CARE_PROVIDER_SITE_OTHER): Payer: BLUE CROSS/BLUE SHIELD | Admitting: Psychology

## 2017-10-10 DIAGNOSIS — F331 Major depressive disorder, recurrent, moderate: Secondary | ICD-10-CM

## 2017-10-10 DIAGNOSIS — F411 Generalized anxiety disorder: Secondary | ICD-10-CM

## 2017-10-11 ENCOUNTER — Ambulatory Visit: Payer: BLUE CROSS/BLUE SHIELD | Admitting: Physician Assistant

## 2017-10-14 ENCOUNTER — Other Ambulatory Visit: Payer: Self-pay

## 2017-10-14 ENCOUNTER — Ambulatory Visit (INDEPENDENT_AMBULATORY_CARE_PROVIDER_SITE_OTHER): Payer: BLUE CROSS/BLUE SHIELD | Admitting: Physician Assistant

## 2017-10-14 ENCOUNTER — Encounter: Payer: Self-pay | Admitting: Physician Assistant

## 2017-10-14 VITALS — BP 118/70 | HR 70 | Temp 98.1°F | Resp 14 | Ht 66.0 in | Wt 154.0 lb

## 2017-10-14 DIAGNOSIS — A4902 Methicillin resistant Staphylococcus aureus infection, unspecified site: Secondary | ICD-10-CM | POA: Diagnosis not present

## 2017-10-14 DIAGNOSIS — F411 Generalized anxiety disorder: Secondary | ICD-10-CM | POA: Diagnosis not present

## 2017-10-14 MED ORDER — SUVOREXANT 10 MG PO TABS: 1.0000 | ORAL_TABLET | Freq: Every day | ORAL | 3 refills | Status: AC

## 2017-10-14 MED ORDER — MUPIROCIN CALCIUM 2 % NA OINT: 1.0000 "application " | TOPICAL_OINTMENT | Freq: Two times a day (BID) | NASAL | 0 refills | Status: DC

## 2017-10-14 MED ORDER — DOXYCYCLINE HYCLATE 100 MG PO CAPS: 100.0000 mg | ORAL_CAPSULE | Freq: Two times a day (BID) | ORAL | 0 refills | Status: DC

## 2017-10-14 MED ORDER — HYDROXYZINE HCL 10 MG PO TABS: 10.0000 mg | ORAL_TABLET | Freq: Three times a day (TID) | ORAL | 0 refills | Status: DC | PRN

## 2017-10-14 NOTE — Progress Notes (Signed)
Patient presents to clinic today c/o 2 weeks of painful bumps on her face that start as red and swollen areas that pop, leaving a scar. States this are not like prior episodes of acne. Notes history of MRSA. Denies fever, chills. States the areas start to heal over well but she obsessively picks at them.     Past Medical History:  Diagnosis Date  . Asthma   . Depression   . Frequent headaches   . Heart murmur   . History of chicken pox   . 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 Shingles    3 at onset  . Syncope   . TIA (transient ischemic attack) 2013    Current Outpatient Medications on File Prior to Visit  Medication Sig Dispense Refill  . ALPRAZolam (XANAX) 0.25 MG tablet TAKE 1 TABLET BY MOUTH TWICE DAILY AS NEEDED FOR ANXIETY 30 tablet 1  . AMITIZA 24 MCG capsule TAKE 1 CAPSULE BY MOUTH TWICE DAILY WITH A MEAL 60 capsule 2  . amphetamine-dextroamphetamine (ADDERALL XR) 30 MG 24 hr capsule Take 1 capsule (30 mg total) by mouth every morning. 30 capsule 0  . amphetamine-dextroamphetamine (ADDERALL) 10 MG tablet Take 1 tablet (10 mg total) by mouth daily with breakfast. 30 tablet 0  . sertraline (ZOLOFT) 100 MG tablet Take 1 tablet (100 mg total) by mouth daily. 30 tablet 3  . BELSOMRA 10 MG TABS TAKE 1 TABLET BY MOUTH AT BEDTIME (Patient not taking: Reported on 10/14/2017) 30 tablet 0   No current facility-administered medications on file prior to visit.     Allergies  Allergen Reactions  . Tinidazole Hives and Rash  . Betadine [Povidone Iodine] Itching and Rash    Family History  Problem Relation Age of Onset  . Hypertension Mother        Living  . Breast cancer Mother   . Leukemia Mother   . Bipolar disorder Mother   . COPD Father   . Diabetes Father   . Heart disease Father [id number]       Deceased  . Congestive Heart Failure Father   . Diabetes Sister   . Colon cancer Maternal Grandfather   . Lung cancer Paternal Grandmother   . Diabetes Paternal  Grandmother   . Pancreatic cancer Paternal Uncle   . COPD Paternal Uncle   . Congestive Heart Failure Paternal Uncle   . Heart defect Paternal Uncle   . Stroke Paternal Uncle   . Breast cancer Maternal Aunt        x2  . Stroke Maternal Aunt   . Lung cancer Maternal Aunt   . Diabetes Sister   . Obesity Sister   . Diabetes Other        Paternal-side  . Heart murmur Son   . Healthy Daughter   . Asthma Son        #2  . Seizures Son        #2  . Behavior problems Son        #2    Social History   Socioeconomic History  . Marital status: Single    Spouse name: Not on file  . Number of children: 5  . Years of education: Not on file  . Highest education level: Not on file  Occupational History  . Not on file  Social Needs  . Financial resource strain: Not on file  . Food insecurity:    Worry: Not on file  Inability: Not on file  . Transportation needs:    Medical: Not on file    Non-medical: Not on file  Tobacco Use  . Smoking status: Current Every Day Smoker    Packs/day: 1.00    Years: 15.00    Pack years: 15.00    Types: Cigarettes  . Smokeless tobacco: Never Used  Substance and Sexual Activity  . Alcohol use: No    Alcohol/week: 0.0 oz  . Drug use: No  . Sexual activity: Yes    Birth control/protection: Surgical  Lifestyle  . Physical activity:    Days per week: Not on file    Minutes per session: Not on file  . Stress: Not on file  Relationships  . Social connections:    Talks on phone: Not on file    Gets together: Not on file    Attends religious service: Not on file    Active member of club or organization: Not on file    Attends meetings of clubs or organizations: Not on file    Relationship status: Not on file  Other Topics Concern  . Not on file  Social History Narrative  . Not on file   Review of Systems - See HPI.  All other ROS are negative.  BP 118/70   Pulse 70   Temp 98.1 F (36.7 C) (Oral)   Resp 14   Ht 5\' 6"  (1.676 m)   Wt  154 lb (69.9 kg)   SpO2 99%   BMI 24.86 kg/m   Physical Exam  Constitutional: She appears well-developed and well-nourished.  HENT:  Head: Normocephalic and atraumatic.  Cardiovascular: Normal rate and regular rhythm.  Pulmonary/Chest: Effort normal.  Psychiatric: She has a normal mood and affect.  Vitals reviewed.   Recent Results (from the past 2160 hour(s))  CBC with Differential/Platelet     Status: None   Collection Time: 08/19/17  9:51 AM  Result Value Ref Range   WBC 6.4 4.0 - 10.5 K/uL   RBC 4.89 3.87 - 5.11 Mil/uL   Hemoglobin 14.0 12.0 - 15.0 g/dL   HCT 16.1 09.6 - 04.5 %   MCV 85.8 78.0 - 100.0 fl   MCHC 33.3 30.0 - 36.0 g/dL   RDW 40.9 81.1 - 91.4 %   Platelets 206.0 150.0 - 400.0 K/uL   Neutrophils Relative % 58.8 43.0 - 77.0 %   Lymphocytes Relative 28.8 12.0 - 46.0 %   Monocytes Relative 9.9 3.0 - 12.0 %   Eosinophils Relative 1.9 0.0 - 5.0 %   Basophils Relative 0.6 0.0 - 3.0 %   Neutro Abs 3.7 1.4 - 7.7 K/uL   Lymphs Abs 1.8 0.7 - 4.0 K/uL   Monocytes Absolute 0.6 0.1 - 1.0 K/uL   Eosinophils Absolute 0.1 0.0 - 0.7 K/uL   Basophils Absolute 0.0 0.0 - 0.1 K/uL  Comprehensive metabolic panel     Status: None   Collection Time: 08/19/17  9:51 AM  Result Value Ref Range   Sodium 139 135 - 145 mEq/L   Potassium 3.9 3.5 - 5.1 mEq/L   Chloride 103 96 - 112 mEq/L   CO2 25 19 - 32 mEq/L   Glucose, Bld 72 70 - 99 mg/dL   BUN 8 6 - 23 mg/dL   Creatinine, Ser 7.82 0.40 - 1.20 mg/dL   Total Bilirubin 0.4 0.2 - 1.2 mg/dL   Alkaline Phosphatase 67 39 - 117 U/L   AST 12 0 - 37 U/L   ALT 7  0 - 35 U/L   Total Protein 6.8 6.0 - 8.3 g/dL   Albumin 4.4 3.5 - 5.2 g/dL   Calcium 9.4 8.4 - 96.0 mg/dL   GFR 454.09 >81.19 mL/min  Lipid panel     Status: None   Collection Time: 08/19/17  9:51 AM  Result Value Ref Range   Cholesterol 132 0 - 200 mg/dL    Comment: ATP III Classification       Desirable:  < 200 mg/dL               Borderline High:  200 - 239 mg/dL           High:  > = 147 mg/dL   Triglycerides 82.9 0.0 - 149.0 mg/dL    Comment: Normal:  <562 mg/dLBorderline High:  150 - 199 mg/dL   HDL 13.08 >65.78 mg/dL   VLDL 46.9 0.0 - 62.9 mg/dL   LDL Cholesterol 67 0 - 99 mg/dL   Total CHOL/HDL Ratio 3     Comment:                Men          Women1/2 Average Risk     3.4          3.3Average Risk          5.0          4.42X Average Risk          9.6          7.13X Average Risk          15.0          11.0                       NonHDL 81.24     Comment: NOTE:  Non-HDL goal should be 30 mg/dL higher than patient's LDL goal (i.e. LDL goal of < 70 mg/dL, would have non-HDL goal of < 100 mg/dL)  TSH     Status: None   Collection Time: 08/19/17  9:51 AM  Result Value Ref Range   TSH 3.46 0.35 - 4.50 uIU/mL   Assessment/Plan: 1. MRSA infection Start oral Doxycycline and take as directed. Will also start nasal Mupirocin in case of colonization due to hx of MRSA infection. She has got to stop picking at skin. Home measures reviewed to help with this. Rx hydroxyzine to also help with this.  - doxycycline (VIBRAMYCIN) 100 MG capsule; Take 1 capsule (100 mg total) by mouth 2 (two) times daily.  Dispense: 14 capsule; Refill: 0 - mupirocin nasal ointment (BACTROBAN) 2 %; Place 1 application into the nose 2 (two) times daily. Use 1/2 of tube in each nostril BID x 5 days. After, press sides of nose together  Dispense: 10 g; Refill: 0  2. Anxiety state Exacerbated by insomnia. Will start Belsomra again. Voucher given to help with this.  - Suvorexant (BELSOMRA) 10 MG TABS; Take 1 tablet by mouth at bedtime.  Dispense: 30 tablet; Refill: 3   Piedad Climes, [place]

## 2017-10-14 NOTE — Patient Instructions (Signed)
Please keep hydrated and get plenty of rest.  Try to avoid scratching the face. The Hydroxyzine will hopefully help with this. Use the Bactroban nasal ointment as directed. Take the Doxycycline twice daily as directed.  Keep skin clean and dry.  If symptoms are not improving, we will need further assessment with Dermatology.

## 2017-10-28 ENCOUNTER — Other Ambulatory Visit: Payer: Self-pay | Admitting: Physician Assistant

## 2017-10-28 ENCOUNTER — Ambulatory Visit (INDEPENDENT_AMBULATORY_CARE_PROVIDER_SITE_OTHER): Payer: BLUE CROSS/BLUE SHIELD | Admitting: Psychology

## 2017-10-28 DIAGNOSIS — F902 Attention-deficit hyperactivity disorder, combined type: Secondary | ICD-10-CM | POA: Diagnosis not present

## 2017-10-28 DIAGNOSIS — F331 Major depressive disorder, recurrent, moderate: Secondary | ICD-10-CM

## 2017-10-28 DIAGNOSIS — F411 Generalized anxiety disorder: Secondary | ICD-10-CM

## 2017-10-28 MED ORDER — AMPHETAMINE-DEXTROAMPHET ER 30 MG PO CP24: 30.0000 mg | ORAL_CAPSULE | ORAL | 0 refills | Status: DC

## 2017-10-28 MED ORDER — AMPHETAMINE-DEXTROAMPHETAMINE 10 MG PO TABS: 10.0000 mg | ORAL_TABLET | Freq: Every day | ORAL | 0 refills | Status: DC

## 2017-10-28 NOTE — Telephone Encounter (Signed)
Copied from CRM [id number]. Topic: Quick Communication - Rx Refill/Question >> Oct 28, 2017  8:29 AM Maia Pettiesrtiz, Kristie S wrote: Medication: adderall 30mg  ER - 1 left, takes 1/day (will be out 10/29/17) Aderall 10mg  - 1 left, takes 1/day (will be out 10/29/17)  Has the patient contacted their pharmacy? No - controlled Preferred Pharmacy (with phone number or street name): [facility] - [place], [place] - [id number] [phone] (Phone) [phone] (Fax)

## 2017-10-28 NOTE — Telephone Encounter (Signed)
Last OV: 08/19/17 Last Fill: 09/27/17 #30, 0

## 2017-11-22 ENCOUNTER — Other Ambulatory Visit: Payer: Self-pay | Admitting: Physician Assistant

## 2017-11-22 DIAGNOSIS — F32A Depression, unspecified: Secondary | ICD-10-CM

## 2017-11-22 DIAGNOSIS — F329 Major depressive disorder, single episode, unspecified: Secondary | ICD-10-CM

## 2017-11-22 NOTE — Telephone Encounter (Signed)
Last OV 10/14/17 Alprazolam last filled 08/21/17 #30 with 1

## 2017-11-25 ENCOUNTER — Other Ambulatory Visit: Payer: Self-pay | Admitting: Physician Assistant

## 2017-11-25 NOTE — Telephone Encounter (Signed)
Incoming request for refill on ADERALL 30mg  10mg    LOV: 08/19/17 with Waldon MerlWilliam C.  Martin PA-C  Last refill:  10/28/17 #30 0 RF   [facility] [place]

## 2017-11-25 NOTE — Telephone Encounter (Signed)
Copied from CRM [id number]. Topic: Quick Communication - Rx Refill/Question >> Nov 25, 2017  2:17 PM Tamela OddiHarris, Ashur Glatfelter J wrote: Medication: amphetamine-dextroamphetamine (ADDERALL XR) 30 MG 24 hr capsule / amphetamine-dextroamphetamine (ADDERALL) 10 MG tablet  Patient called to request refill for the above medications. CB# [phone]  Preferred Pharmacy (with phone number or street name): [facility] - [place], [place] - [address] [phone] (Phone) [phone])

## 2017-11-26 MED ORDER — AMPHETAMINE-DEXTROAMPHET ER 30 MG PO CP24: 30.0000 mg | ORAL_CAPSULE | ORAL | 0 refills | Status: DC

## 2017-11-26 MED ORDER — AMPHETAMINE-DEXTROAMPHETAMINE 10 MG PO TABS: 10.0000 mg | ORAL_TABLET | Freq: Every day | ORAL | 0 refills | Status: DC

## 2017-11-29 ENCOUNTER — Ambulatory Visit (INDEPENDENT_AMBULATORY_CARE_PROVIDER_SITE_OTHER): Payer: BLUE CROSS/BLUE SHIELD | Admitting: Psychology

## 2017-11-29 DIAGNOSIS — F902 Attention-deficit hyperactivity disorder, combined type: Secondary | ICD-10-CM | POA: Diagnosis not present

## 2017-11-29 DIAGNOSIS — F411 Generalized anxiety disorder: Secondary | ICD-10-CM

## 2017-11-29 DIAGNOSIS — F331 Major depressive disorder, recurrent, moderate: Secondary | ICD-10-CM | POA: Diagnosis not present

## 2017-12-16 ENCOUNTER — Ambulatory Visit (INDEPENDENT_AMBULATORY_CARE_PROVIDER_SITE_OTHER): Payer: BLUE CROSS/BLUE SHIELD | Admitting: Psychology

## 2017-12-16 DIAGNOSIS — F411 Generalized anxiety disorder: Secondary | ICD-10-CM | POA: Diagnosis not present

## 2017-12-16 DIAGNOSIS — F331 Major depressive disorder, recurrent, moderate: Secondary | ICD-10-CM

## 2017-12-16 DIAGNOSIS — F902 Attention-deficit hyperactivity disorder, combined type: Secondary | ICD-10-CM

## 2017-12-17 ENCOUNTER — Ambulatory Visit (INDEPENDENT_AMBULATORY_CARE_PROVIDER_SITE_OTHER): Payer: BLUE CROSS/BLUE SHIELD | Admitting: Physician Assistant

## 2017-12-17 ENCOUNTER — Encounter: Payer: Self-pay | Admitting: Physician Assistant

## 2017-12-17 ENCOUNTER — Other Ambulatory Visit: Payer: Self-pay

## 2017-12-17 VITALS — BP 118/70 | HR 73 | Temp 98.3°F | Resp 16 | Ht 66.0 in | Wt 147.0 lb

## 2017-12-17 DIAGNOSIS — L089 Local infection of the skin and subcutaneous tissue, unspecified: Secondary | ICD-10-CM

## 2017-12-17 MED ORDER — CEPHALEXIN 500 MG PO CAPS: 500.0000 mg | ORAL_CAPSULE | Freq: Two times a day (BID) | ORAL | 0 refills | Status: AC

## 2017-12-17 NOTE — Progress Notes (Signed)
Patient presents to clinic today c/o continued skin lesions of face and neck. Notes only mild improvement with Doxycycline. Used Bactroban nasal ointment as directed. Denies new or worsening symptoms. Has stopped all makeup use. Only using gentle Cetaphil cleansers. No contacts with similar symptoms. Notes symptoms improve when she is out of her house for a few days. Worsens when she is back.    Past Medical History:  Diagnosis Date  . Asthma   . Depression   . Frequent headaches   . Heart murmur   . History of chicken pox   . 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 Shingles    3 at onset  . Syncope   . TIA (transient ischemic attack) 2013    Current Outpatient Medications on File Prior to Visit  Medication Sig Dispense Refill  . ALPRAZolam (XANAX) 0.25 MG tablet TAKE 1 TABLET BY MOUTH TWICE DAILY AS NEEDED FOR ANXIETY 30 tablet 1  . AMITIZA 24 MCG capsule TAKE 1 CAPSULE BY MOUTH TWICE DAILY WITH A MEAL 60 capsule 2  . amphetamine-dextroamphetamine (ADDERALL XR) 30 MG 24 hr capsule Take 1 capsule (30 mg total) by mouth every morning. 30 capsule 0  . amphetamine-dextroamphetamine (ADDERALL) 10 MG tablet Take 1 tablet (10 mg total) by mouth daily with breakfast. 30 tablet 0  . sertraline (ZOLOFT) 100 MG tablet Take 1 tablet (100 mg total) by mouth daily. 30 tablet 3  . Suvorexant (BELSOMRA) 10 MG TABS Take 1 tablet by mouth at bedtime. 30 tablet 3   No current facility-administered medications on file prior to visit.     Allergies  Allergen Reactions  . Tinidazole Hives and Rash  . Betadine [Povidone Iodine] Itching and Rash    Family History  Problem Relation Age of Onset  . Hypertension Mother        Living  . Breast cancer Mother   . Leukemia Mother   . Bipolar disorder Mother   . COPD Father   . Diabetes Father   . Heart disease Father [id number]       Deceased  . Congestive Heart Failure Father   . Diabetes Sister   . Colon cancer Maternal Grandfather   .  Lung cancer Paternal Grandmother   . Diabetes Paternal Grandmother   . Pancreatic cancer Paternal Uncle   . COPD Paternal Uncle   . Congestive Heart Failure Paternal Uncle   . Heart defect Paternal Uncle   . Stroke Paternal Uncle   . Breast cancer Maternal Aunt        x2  . Stroke Maternal Aunt   . Lung cancer Maternal Aunt   . Diabetes Sister   . Obesity Sister   . Diabetes Other        Paternal-side  . Heart murmur Son   . Healthy Daughter   . Asthma Son        #2  . Seizures Son        #2  . Behavior problems Son        #2    Social History   Socioeconomic History  . Marital status: Single    Spouse name: Not on file  . Number of children: 5  . Years of education: Not on file  . Highest education level: Not on file  Occupational History  . Not on file  Social Needs  . Financial resource strain: Not on file  . Food insecurity:    Worry: Not on  file    Inability: Not on file  . Transportation needs:    Medical: Not on file    Non-medical: Not on file  Tobacco Use  . Smoking status: Current Every Day Smoker    Packs/day: 1.00    Years: 15.00    Pack years: 15.00    Types: Cigarettes  . Smokeless tobacco: Never Used  Substance and Sexual Activity  . Alcohol use: No    Alcohol/week: 0.0 standard drinks  . Drug use: No  . Sexual activity: Yes    Birth control/protection: Surgical  Lifestyle  . Physical activity:    Days per week: Not on file    Minutes per session: Not on file  . Stress: Not on file  Relationships  . Social connections:    Talks on phone: Not on file    Gets together: Not on file    Attends religious service: Not on file    Active member of club or organization: Not on file    Attends meetings of clubs or organizations: Not on file    Relationship status: Not on file  Other Topics Concern  . Not on file  Social History Narrative  . Not on file   Review of Systems - See HPI.  All other ROS are negative.  BP 118/70   Pulse 73    Temp 98.3 F (36.8 C) (Oral)   Resp 16   Ht 5\' 6"  (1.676 m)   Wt 147 lb (66.7 kg)   SpO2 100%   BMI 23.73 kg/m   Physical Exam  Constitutional: She is oriented to person, place, and time. She appears well-developed and well-nourished.  HENT:  Head: Normocephalic and atraumatic.  Right Ear: External ear normal.  Left Ear: External ear normal.  Mouth/Throat: Oropharynx is clear and moist.  Neck: Neck supple.  Cardiovascular: Normal rate, regular rhythm, normal heart sounds and intact distal pulses.  Pulmonary/Chest: Effort normal and breath sounds normal. No stridor. No respiratory distress. She has no wheezes. She has no rales. She exhibits no tenderness.  Neurological: She is alert and oriented to person, place, and time.  Skin:     Psychiatric: She has a normal mood and affect.  Vitals reviewed.    Assessment/Plan: 1. Recurrent infection of skin Unclear. Some crusting. Potential atypical Impetigo? Will refer to Derm for further assessment and management. May need punch biopsy. Will give Rx for Keflex to treat impetigo while awaiting Derm assessment.  - Ambulatory referral to Dermatology    Piedad Climes, PA-C

## 2017-12-17 NOTE — Patient Instructions (Signed)
Please take the Keflex as directed with food. I am setting you up with Dermatology for further assessment of these atypical lesions.

## 2017-12-24 ENCOUNTER — Telehealth: Payer: Self-pay | Admitting: Physician Assistant

## 2017-12-24 ENCOUNTER — Other Ambulatory Visit: Payer: Self-pay | Admitting: Physician Assistant

## 2017-12-24 DIAGNOSIS — F329 Major depressive disorder, single episode, unspecified: Secondary | ICD-10-CM

## 2017-12-24 DIAGNOSIS — F32A Depression, unspecified: Secondary | ICD-10-CM

## 2017-12-24 NOTE — Telephone Encounter (Signed)
Refill of adderall  LRF 11/26/17  #30  0 refills  Refill of Xanax  LRF 11/25/17  #30  1 refill   LOV 12/17/17 C. Daphine Deutscher

## 2017-12-24 NOTE — Telephone Encounter (Signed)
Copied from CRM [id number]. Topic: Quick Communication - Rx Refill/Question >> Dec 24, 2017  4:36 PM Raquel Sarna wrote: amphetamine-dextroamphetamine (ADDERALL) 10 MG tablet amphetamine-dextroamphetamine (ADDERALL XR) 30 MG 24 hr capsule ALPRAZolam (XANAX) 0.25 MG tablet  Needing refills  [id number] - [place], [place] - [address] [phone] (Phone) [phone] (Fax)

## 2017-12-24 NOTE — Telephone Encounter (Signed)
Copied from CRM [id number]. Topic: Quick Communication - See Telephone Encounter >> Dec 24, 2017  4:37 PM Raquel Sarna wrote: Pt was taken off of the Zoloft and put Cymbalta 60 mg extended release by her GYN - Dr. Derrek Gu.

## 2017-12-25 ENCOUNTER — Other Ambulatory Visit: Payer: Self-pay | Admitting: Physician Assistant

## 2017-12-25 MED ORDER — AMPHETAMINE-DEXTROAMPHETAMINE 10 MG PO TABS: 10.0000 mg | ORAL_TABLET | Freq: Every day | ORAL | 0 refills | Status: DC

## 2017-12-25 MED ORDER — AMPHETAMINE-DEXTROAMPHET ER 30 MG PO CP24: 30.0000 mg | ORAL_CAPSULE | ORAL | 0 refills | Status: DC

## 2017-12-25 MED ORDER — HYDROXYZINE HCL 10 MG PO TABS: 10.0000 mg | ORAL_TABLET | Freq: Three times a day (TID) | ORAL | 0 refills | Status: AC | PRN

## 2017-12-25 NOTE — Telephone Encounter (Signed)
Med list updated

## 2017-12-27 ENCOUNTER — Ambulatory Visit (INDEPENDENT_AMBULATORY_CARE_PROVIDER_SITE_OTHER): Payer: BLUE CROSS/BLUE SHIELD | Admitting: Psychology

## 2017-12-27 DIAGNOSIS — F411 Generalized anxiety disorder: Secondary | ICD-10-CM

## 2017-12-27 DIAGNOSIS — F331 Major depressive disorder, recurrent, moderate: Secondary | ICD-10-CM | POA: Diagnosis not present

## 2017-12-27 DIAGNOSIS — F902 Attention-deficit hyperactivity disorder, combined type: Secondary | ICD-10-CM | POA: Diagnosis not present

## 2018-01-03 ENCOUNTER — Ambulatory Visit: Payer: BLUE CROSS/BLUE SHIELD | Admitting: Psychology

## 2018-01-10 ENCOUNTER — Ambulatory Visit (INDEPENDENT_AMBULATORY_CARE_PROVIDER_SITE_OTHER): Payer: BLUE CROSS/BLUE SHIELD | Admitting: Psychology

## 2018-01-10 DIAGNOSIS — F411 Generalized anxiety disorder: Secondary | ICD-10-CM

## 2018-01-10 DIAGNOSIS — F902 Attention-deficit hyperactivity disorder, combined type: Secondary | ICD-10-CM

## 2018-01-10 DIAGNOSIS — F331 Major depressive disorder, recurrent, moderate: Secondary | ICD-10-CM | POA: Diagnosis not present

## 2018-01-24 ENCOUNTER — Other Ambulatory Visit: Payer: Self-pay | Admitting: Physician Assistant

## 2018-01-24 NOTE — Telephone Encounter (Signed)
Requested medication (s) are due for refill today: yes  Requested medication (s) are on the active medication list: yes  Last refill:  12/25/17  Future visit scheduled: yes  Notes to clinic:  Controlled substance   Requested Prescriptions  Pending Prescriptions Disp Refills   amphetamine-dextroamphetamine (ADDERALL XR) 30 MG 24 hr capsule 30 capsule 0    Sig: Take 1 capsule (30 mg total) by mouth every morning.     Not Delegated - Psychiatry:  Stimulants/ADHD Failed - 01/24/2018  1:25 PM      Failed - This refill cannot be delegated      Failed - Urine Drug Screen completed in last 360 days.      Passed - Valid encounter within last 3 months    Recent Outpatient Visits          1 month ago Recurrent infection of skin   [facility] [place], [place], [place]   3 months ago MRSA infection   [facility] [place], [place], [place]   5 months ago Visit for preventive health examination   [facility] [place], [place], [place]   5 months ago Depressive disorder   [facility] [place] Mer[place], [place]   8 months ago Anxiety state   [facility] [place], Kristine Garbe, [place]      Future Appointments            In 4 months Waldon Merl, PA-C [facility], [facility]          amphetamine-dextroamphetamine (ADDERALL) 10 MG tablet 30 tablet 0    Sig: Take 1 tablet (10 mg total) by mouth daily with breakfast.     Not Delegated - Psychiatry:  Stimulants/ADHD Failed - 01/24/2018  1:25 PM      Failed - This refill cannot be delegated      Failed - Urine Drug Screen completed in last 360 days.      Passed - Valid encounter within last 3 months    Recent Outpatient Visits          1 month ago Recurrent infection of skin   [facility] [place], [place], [place]   3 months ago MRSA infection   [facility] [place], [place], [place]   5 months ago Visit for preventive health examination   [facility] [place], [place], [place]   5 months ago Depressive disorder   [facility] [place], Kristine Garbe, [place]   8 months ago Anxiety [facility] [place], Kristine Garbe, [place]      Future Appointments            In 4 months Waldon Merl, PA-C [facility] [employer], [place]

## 2018-01-24 NOTE — Telephone Encounter (Signed)
Copied from CRM [id number]. Topic: Quick Communication - Rx Refill/Question >> Jan 24, 2018  1:11 PM Luanna Cole wrote: Medication: amphetamine-dextroamphetamine (ADDERALL XR) 30 MG 24 hr capsule [[id number]] amphetamine-dextroamphetamine (ADDERALL) 10 MG tablet [[id number]]   Has the patient contacted their pharmacy?  Preferred Pharmacy (with phone number or street name): [facility] - [place], [place] - [address] [phone] (Phone) [phone] (Fax)   Agent: Please be advised that RX refills may take up to 3 business days. We ask that you follow-up with your pharmacy.

## 2018-01-27 MED ORDER — AMPHETAMINE-DEXTROAMPHET ER 30 MG PO CP24: 30.0000 mg | ORAL_CAPSULE | ORAL | 0 refills | Status: DC

## 2018-01-27 MED ORDER — AMPHETAMINE-DEXTROAMPHETAMINE 10 MG PO TABS: 10.0000 mg | ORAL_TABLET | Freq: Every day | ORAL | 0 refills | Status: DC

## 2018-02-07 ENCOUNTER — Ambulatory Visit: Payer: BLUE CROSS/BLUE SHIELD | Admitting: Psychology

## 2018-02-21 ENCOUNTER — Ambulatory Visit: Payer: BLUE CROSS/BLUE SHIELD | Admitting: Psychology
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
## 2018-03-13 ENCOUNTER — Other Ambulatory Visit: Payer: Self-pay | Admitting: Physician Assistant

## 2018-03-13 ENCOUNTER — Other Ambulatory Visit: Payer: Self-pay | Admitting: Emergency Medicine

## 2018-03-13 NOTE — Telephone Encounter (Signed)
Last rx 02/06/18 Adderall XR 30 mg #30   Adderall 10 mg #30  Last OV: 12/17/17 Acute skin infection CSC: 08/07/17 UDS: None  Please advise

## 2018-03-13 NOTE — Telephone Encounter (Signed)
Copied from CRM [id number]. Topic: Quick Communication - Rx Refill/Question >> Mar 13, 2018  3:01 PM Percival SpanishKennedy, Cheryl W wrote: Medication    amphetamine-dextroamphetamine (ADDERALL XR) 30 MG 24 hr capsule   amphetamine-dextroamphetamine (ADDERALL) 10 MG tablet      [place] [facility] [address] [place]     Has the patient contacted their pharmacy  yes   Preferred Pharmacy      amphetamine-dextroamphetamine (ADDERALL XR) 30 MG 24 hr capsule   amphetamine-dextroamphetamine (ADDERALL) 10 MG tablet    Agent: Please be advised that RX refills may take up to 3 business days. We ask that you follow-up with your pharmacy.

## 2018-03-14 MED ORDER — AMPHETAMINE-DEXTROAMPHET ER 30 MG PO CP24: 30.0000 mg | ORAL_CAPSULE | ORAL | 0 refills | Status: DC

## 2018-03-14 MED ORDER — AMPHETAMINE-DEXTROAMPHETAMINE 10 MG PO TABS: 10.0000 mg | ORAL_TABLET | Freq: Every day | ORAL | 0 refills | Status: DC

## 2018-04-04 ENCOUNTER — Other Ambulatory Visit: Payer: Self-pay

## 2018-04-04 ENCOUNTER — Encounter (HOSPITAL_BASED_OUTPATIENT_CLINIC_OR_DEPARTMENT_OTHER): Payer: Self-pay | Admitting: Emergency Medicine

## 2018-04-04 ENCOUNTER — Emergency Department (HOSPITAL_BASED_OUTPATIENT_CLINIC_OR_DEPARTMENT_OTHER)
Admission: EM | Admit: 2018-04-04 | Discharge: 2018-04-04 | Disposition: A | Discharge status: Home / Self Care | Payer: BLUE CROSS/BLUE SHIELD | Attending: Emergency Medicine | Admitting: Emergency Medicine

## 2018-04-04 DIAGNOSIS — F329 Major depressive disorder, single episode, unspecified: Secondary | ICD-10-CM

## 2018-04-04 DIAGNOSIS — F331 Major depressive disorder, recurrent, moderate: Secondary | ICD-10-CM | POA: Insufficient documentation

## 2018-04-04 DIAGNOSIS — Z8673 Personal history of transient ischemic attack (TIA), and cerebral infarction without residual deficits: Secondary | ICD-10-CM | POA: Insufficient documentation

## 2018-04-04 DIAGNOSIS — R4589 Other symptoms and signs involving emotional state: Secondary | ICD-10-CM

## 2018-04-04 DIAGNOSIS — R45851 Suicidal ideations: Secondary | ICD-10-CM | POA: Insufficient documentation

## 2018-04-04 DIAGNOSIS — J45909 Unspecified asthma, uncomplicated: Secondary | ICD-10-CM | POA: Insufficient documentation

## 2018-04-04 DIAGNOSIS — F17210 Nicotine dependence, cigarettes, uncomplicated: Secondary | ICD-10-CM | POA: Insufficient documentation

## 2018-04-04 DIAGNOSIS — Z79899 Other long term (current) drug therapy: Secondary | ICD-10-CM | POA: Insufficient documentation

## 2018-04-04 LAB — ETHANOL: Alcohol, Ethyl (B): <10 mg/dL (ref <10)

## 2018-04-04 LAB — CBC
HEMATOCRIT: 43.3 % (ref 36.0–46.0)
Hemoglobin: 13.9 g/dL (ref 12.0–15.0)
MCH: 28.5 pg (ref 26.0–34.0)
MCHC: 32.1 g/dL (ref 30.0–36.0)
MCV: 88.9 fL (ref 80.0–100.0)
Platelets: 281 10*3/uL (ref 150–400)
RBC: 4.87 MIL/uL (ref 3.87–5.11)
RDW: 14 % (ref 11.5–15.5)
WBC: 15.1 10*3/uL — ABNORMAL HIGH (ref 4.0–10.5)
nRBC: 0 % (ref 0.0–0.2)

## 2018-04-04 LAB — COMPREHENSIVE METABOLIC PANEL
ALT: 17 U/L (ref 0–44)
AST: 19 U/L (ref 15–41)
Albumin: 4.2 g/dL (ref 3.5–5.0)
Alkaline Phosphatase: 62 U/L (ref 38–126)
Anion gap: 9 (ref 5–15)
BUN: 17 mg/dL (ref 6–20)
CHLORIDE: 104 mmol/L (ref 98–111)
CO2: 24 mmol/L (ref 22–32)
CREATININE: 0.63 mg/dL (ref 0.44–1.00)
Calcium: 9.1 mg/dL (ref 8.9–10.3)
GFR calc Af Amer: >60 mL/min (ref >60)
GFR calc non Af Amer: >60 mL/min (ref >60)
Glucose, Bld: 115 mg/dL — ABNORMAL HIGH (ref 70–99)
Potassium: 3.3 mmol/L — ABNORMAL LOW (ref 3.5–5.1)
Sodium: 137 mmol/L (ref 135–145)
Total Bilirubin: 0.4 mg/dL (ref 0.3–1.2)
Total Protein: 7.4 g/dL (ref 6.5–8.1)

## 2018-04-04 LAB — RAPID URINE DRUG SCREEN, HOSP PERFORMED
Amphetamines: POSITIVE — AB
Barbiturates: NOT DETECTED
Benzodiazepines: NOT DETECTED
Cocaine: NOT DETECTED
Opiates: NOT DETECTED
Tetrahydrocannabinol: NOT DETECTED

## 2018-04-04 LAB — ACETAMINOPHEN LEVEL: Acetaminophen (Tylenol), Serum: <10 ug/mL — ABNORMAL LOW (ref 10–30)

## 2018-04-04 LAB — PREGNANCY, URINE: Preg Test, Ur: NEGATIVE

## 2018-04-04 LAB — SALICYLATE LEVEL: Salicylate Lvl: <7 mg/dL (ref 2.8–30.0)

## 2018-04-04 MED ORDER — POTASSIUM CHLORIDE CRYS ER 20 MEQ PO TBCR
40.0000 meq | EXTENDED_RELEASE_TABLET | Freq: Once | ORAL | Status: AC
  Administered 2018-04-04: 40 meq via ORAL
  Filled 2018-04-04: qty 2
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
## 2018-04-04 NOTE — ED Provider Notes (Signed)
[facility] Provider Note: Judy Dell, MD, FACEP  CSN: [id number] MRN: [id number] ARRIVAL: 04/04/18 at 0001 ROOM: MH12/[facility]12   CHIEF COMPLAINT  Suicidal   HISTORY OF PRESENT ILLNESS  04/04/18 1:42 AM Judy Walters is a 42 y.o. female with a history of depression.  She ran out of her Cymbalta 5 days ago and never got it refilled.  Since then she has "not been sleeping".  She paces around a lot and tries to keep busy.  She has had little appetite.  Yesterday evening she had a prolonged crying spell.  She tells me she is not sure what she was thinking during this crying spell but denies suicidal ideation at the present time.  Nursing notes indicates she had thoughts of killing herself with a brother's gun.  She was tearful during triage but not with me.  She lost her job in October and lost her health insurance, hence has not had follow-up with her mental health professional.    Past Medical History:  Diagnosis Date  . Asthma   . Depression   . Frequent headaches   . Heart murmur   . History of chicken pox   . 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 Shingles    3 at onset  . Syncope   . TIA (transient ischemic attack) 2013    Past Surgical History:  Procedure Laterality Date  . APPENDECTOMY  1989  . carpel tunnel  2012  . CESAREAN SECTION  2012  . TONSILLECTOMY  1985  . TUBAL LIGATION    . WISDOM TOOTH EXTRACTION      Family History  Problem Relation Age of Onset  . Hypertension Mother        Living  . Breast cancer Mother   . Leukemia Mother   . Bipolar disorder Mother   . COPD Father   . Diabetes Father   . Heart disease Father 42       Deceased  . Congestive Heart Failure Father   . Diabetes Sister   . Colon cancer Maternal Grandfather   . Lung cancer Paternal Grandmother   . Diabetes Paternal Grandmother   . Pancreatic cancer Paternal Uncle   . COPD Paternal Uncle   . Congestive Heart Failure Paternal Uncle   . Heart defect  Paternal Uncle   . Stroke Paternal Uncle   . Breast cancer Maternal Aunt        x2  . Stroke Maternal Aunt   . Lung cancer Maternal Aunt   . Diabetes Sister   . Obesity Sister   . Diabetes Other        Paternal-side  . Heart murmur Son   . Healthy Daughter   . Asthma Son        #2  . Seizures Son        #2  . Behavior problems Son        #2    Social History   Tobacco Use  . Smoking status: Current Every Day Smoker    Packs/day: 1.00    Years: 15.00    Pack years: 15.00    Types: Cigarettes  . Smokeless tobacco: Never Used  Substance Use Topics  . Alcohol use: No    Alcohol/week: 0.0 standard drinks  . Drug use: No    Prior to Admission medications   Medication Sig Start Date End Date Taking? Authorizing Provider  ALPRAZolam (XANAX) 0.25 MG tablet TAKE 1 TABLET BY  MOUTH TWICE DAILY AS NEEDED FOR ANXIETY 11/25/17   Waldon MerlMartin, William C, PA-C  AMITIZA 24 MCG capsule TAKE 1 CAPSULE BY MOUTH TWICE DAILY WITH A MEAL 09/26/17   Meredith PelGuenther, Paula M, NP  amphetamine-dextroamphetamine (ADDERALL XR) 30 MG 24 hr capsule Take 1 capsule (30 mg total) by mouth every morning. No further refills without follow-up. 03/14/18   Waldon MerlMartin, William C, PA-C  amphetamine-dextroamphetamine (ADDERALL) 10 MG tablet Take 1 tablet (10 mg total) by mouth daily with breakfast. No further refills without follow-up. 03/14/18   Waldon MerlMartin, William C, PA-C  DULoxetine (CYMBALTA) 60 MG capsule Take 60 mg by mouth daily.    [provider]  hydrOXYzine (ATARAX/VISTARIL) 10 MG tablet Take 1 tablet (10 mg total) by mouth 3 (three) times daily as needed. 12/25/17   Waldon MerlMartin, William C, PA-C  Suvorexant (BELSOMRA) 10 MG TABS Take 1 tablet by mouth at bedtime. 10/14/17   Waldon MerlMartin, William C, PA-C    Allergies Tinidazole and Betadine [povidone iodine]   REVIEW OF SYSTEMS  Negative except as noted here or in the History of Present Illness.   PHYSICAL EXAMINATION  Initial Vital Signs Blood pressure 138/86, pulse 94,  temperature (!) 97.5 F (36.4 C), temperature source Oral, resp. rate 18, height 5\' 5"  (1.651 m), weight 63.5 kg, last menstrual period 03/24/2018, SpO2 100 %.  Examination General: Well-developed, well-nourished female in no acute distress; appearance consistent with age of record HENT: normocephalic; atraumatic Eyes: pupils equal, round and reactive to light; extraocular muscles intact Neck: supple Heart: regular rate and rhythm Lungs: clear to auscultation bilaterally Abdomen: soft; nondistended; nontender; bowel sounds present Extremities: No deformity; full range of motion; pulses normal Neurologic: Awake, alert and oriented; motor function intact in all extremities and symmetric; no facial droop Skin: Warm and dry Psychiatric: Depressed mood with congruent affect; no SI; no HI   RESULTS  Summary of this visit's results, reviewed by myself:   EKG Interpretation  Date/Time:    Ventricular Rate:    PR Interval:    QRS Duration:   QT Interval:    QTC Calculation:   R Axis:     Text Interpretation:        Laboratory Studies: Results for orders placed or performed during the hospital encounter of 04/04/18 (from the past 24 hour(s))  Comprehensive metabolic panel     Status: Abnormal   Collection Time: 04/04/18 12:24 AM  Result Value Ref Range   Sodium 137 135 - 145 mmol/L   Potassium 3.3 (L) 3.5 - 5.1 mmol/L   Chloride 104 98 - 111 mmol/L   CO2 24 22 - 32 mmol/L   Glucose, Bld 115 (H) 70 - 99 mg/dL   BUN 17 6 - 20 mg/dL   Creatinine, Ser 1.610.63 0.44 - 1.00 mg/dL   Calcium 9.1 8.9 - 09.610.3 mg/dL   Total Protein 7.4 6.5 - 8.1 g/dL   Albumin 4.2 3.5 - 5.0 g/dL   AST 19 15 - 41 U/L   ALT 17 0 - 44 U/L   Alkaline Phosphatase 62 38 - 126 U/L   Total Bilirubin 0.4 0.3 - 1.2 mg/dL   GFR calc non Af Amer >60 >60 mL/min   GFR calc Af Amer >60 >60 mL/min   Anion gap 9 5 - 15  Ethanol     Status: None   Collection Time: 04/04/18 12:24 AM  Result Value Ref Range   Alcohol,  Ethyl (B) <10 <10 mg/dL  Salicylate level     Status:  None   Collection Time: 04/04/18 12:24 AM  Result Value Ref Range   Salicylate Lvl <7.0 2.8 - 30.0 mg/dL  Acetaminophen level     Status: Abnormal   Collection Time: 04/04/18 12:24 AM  Result Value Ref Range   Acetaminophen (Tylenol), Serum <10 (L) 10 - 30 ug/mL  cbc     Status: Abnormal   Collection Time: 04/04/18 12:24 AM  Result Value Ref Range   WBC 15.1 (H) 4.0 - 10.5 K/uL   RBC 4.87 3.87 - 5.11 MIL/uL   Hemoglobin 13.9 12.0 - 15.0 g/dL   HCT 09.8 11.9 - 14.7 %   MCV 88.9 80.0 - 100.0 fL   MCH 28.5 26.0 - 34.0 pg   MCHC 32.1 30.0 - 36.0 g/dL   RDW 82.9 56.2 - 13.0 %   Platelets 281 150 - 400 K/uL   nRBC 0.0 0.0 - 0.2 %  Rapid urine drug screen (hospital performed)     Status: Abnormal   Collection Time: 04/04/18 12:24 AM  Result Value Ref Range   Opiates NONE DETECTED NONE DETECTED   Cocaine NONE DETECTED NONE DETECTED   Benzodiazepines NONE DETECTED NONE DETECTED   Amphetamines POSITIVE (A) NONE DETECTED   Tetrahydrocannabinol NONE DETECTED NONE DETECTED   Barbiturates NONE DETECTED NONE DETECTED  Pregnancy, urine     Status: None   Collection Time: 04/04/18 12:24 AM  Result Value Ref Range   Preg Test, Ur NEGATIVE NEGATIVE   Imaging Studies: No results found.  ED COURSE and MDM  Nursing notes and initial vitals signs, including pulse oximetry, reviewed.  Vitals:   04/04/18 0009  BP: 138/86  Pulse: 94  Resp: 18  Temp: (!) 97.5 F (36.4 C)  TempSrc: Oral  SpO2: 100%  Weight: 63.5 kg  Height: 5\' 5"  (1.651 m)   1:52 AM TTS has assessed patient and she does not meet inpatient criteria.  They recommend outpatient referral.  PROCEDURES    ED DIAGNOSES     ICD-10-CM   1. Suicidal thoughts R45.851   2. Depressed mood F32.9        Mikhai Bienvenue, MD 04/04/18 [phone]

## 2018-04-04 NOTE — Progress Notes (Signed)
Nurse, Susy FrizzleMatt informed of pt dispostion and stated that he would also relay to information to Dr. Read DriversMolpus.

## 2018-04-04 NOTE — ED Notes (Signed)
[facility] Stated she recommends outpatient follow up. EDP will be notified

## 2018-04-04 NOTE — ED Notes (Signed)
Pt understood dc material. NAD noted. Informed that ED open 24/7 if she were to need additional help or intervention. Pt also given list of outpatient resources per behavioral health.

## 2018-04-04 NOTE — ED Notes (Signed)
Security notified that patient has been changed out and needs to be cleared.

## 2018-04-04 NOTE — ED Notes (Signed)
TTS at bedside. 

## 2018-04-04 NOTE — ED Notes (Signed)
security at bedside to wand patient

## 2018-04-04 NOTE — ED Notes (Signed)
Spoke with Pieter PartridgeMiriam and she stated she would fax over the outpatient resources for the patient

## 2018-04-04 NOTE — ED Notes (Signed)
ED Provider at bedside. 

## 2018-04-04 NOTE — ED Triage Notes (Signed)
Pt states she just lost her job and she doesn't have her antidepressant medication, pt states she is been having increase though about killing her self with a brother's gun. Pt is very tearful during triage.

## 2018-04-11 ENCOUNTER — Ambulatory Visit: Payer: Self-pay | Admitting: Physician Assistant

## 2018-04-22 ENCOUNTER — Ambulatory Visit: Payer: Self-pay

## 2018-04-22 NOTE — Telephone Encounter (Signed)
Last Filled: 03/14/18 Last OV:  (acute) - 12/17/17,  Appt with medication changes: 08/19/17
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
## 2018-04-22 NOTE — Telephone Encounter (Signed)
Patient is calling and states she is out of both of these medications and states Selena BattenCody told her that she did not need to come to the appointment in December and that she has a appointment in 06/20/18 and is wanting these refilled. Please advise.

## 2018-04-24 ENCOUNTER — Telehealth: Payer: Self-pay | Admitting: Physician Assistant

## 2018-04-24 MED ORDER — AMPHETAMINE-DEXTROAMPHET ER 30 MG PO CP24: 30.0000 mg | ORAL_CAPSULE | ORAL | 0 refills | Status: AC

## 2018-04-24 MED ORDER — AMPHETAMINE-DEXTROAMPHETAMINE 10 MG PO TABS: 10.0000 mg | ORAL_TABLET | Freq: Every day | ORAL | 0 refills | Status: AC

## 2018-04-24 NOTE — Telephone Encounter (Signed)
Prescriptions filled

## 2018-04-24 NOTE — Telephone Encounter (Signed)
Copied from CRM [id number]. Topic: General - Other >> Apr 24, 2018 12:44 PM Leafy Roobinson, Norma J wrote: Reason for CRM:pt needs generic adderall xr 30 mg and 10 mg. Pt is out of medications. Pt has an appt on 1/720 and adhd follow up on 06/20/18 pharm  [facility] in  [place] on [address]

## 2018-04-24 NOTE — Addendum Note (Signed)
Addended by: Sheliah Hatch on: 04/24/2018 01:53 PM   Modules accepted: Orders

## 2018-04-24 NOTE — Telephone Encounter (Signed)
Patient's medications were refused because she needed a follow-up.  A follow-up was made, and patient is being seen for an acute visit next week however, she is calling back saying that she is completely out of medication and needs this filled.   PCP is not in the office. I am routing to another provider to see if she is willing to give enough to get to the appointment or if she will have to wait.

## 2018-04-29 ENCOUNTER — Ambulatory Visit: Payer: Self-pay | Admitting: Physician Assistant

## 2018-06-20 ENCOUNTER — Ambulatory Visit: Payer: Self-pay | Admitting: Physician Assistant

## 2018-09-17 ENCOUNTER — Encounter: Payer: Self-pay | Admitting: Emergency Medicine

## 2018-10-01 ENCOUNTER — Encounter: Payer: Self-pay | Admitting: Emergency Medicine

## 2020-08-08 ENCOUNTER — Telehealth: Payer: Self-pay

## 2020-08-08 NOTE — Telephone Encounter (Signed)
LVM about getting scheduled for a TOC.
